# Patient Record
Sex: Female | Born: 1990 | Race: White | Hispanic: No | Marital: Married | Smoking: Current some day smoker
Health system: Southern US, Community
[De-identification: ages and names within clinical notes are randomized; demographics above are authoritative.]

## PROBLEM LIST (undated history)

## (undated) DIAGNOSIS — E039 Hypothyroidism, unspecified: Secondary | ICD-10-CM

## (undated) HISTORY — PX: TUBAL LIGATION: SHX77

## (undated) HISTORY — PX: ADENOIDECTOMY: SUR15

## (undated) HISTORY — PX: TONSILLECTOMY: SUR1361

## (undated) HISTORY — PX: MYRINGOTOMY: SUR874

## (undated) HISTORY — PX: DILATION AND CURETTAGE OF UTERUS: SHX78

---
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
## 2008-01-24 ENCOUNTER — Inpatient Hospital Stay (HOSPITAL_COMMUNITY): Admission: AD | Admit: 2008-01-24 | Discharge: 2008-01-24 | Payer: Self-pay | Admitting: Obstetrics & Gynecology

## 2008-03-20 ENCOUNTER — Inpatient Hospital Stay (HOSPITAL_COMMUNITY): Admission: AD | Admit: 2008-03-20 | Discharge: 2008-03-21 | Payer: Self-pay | Admitting: Obstetrics & Gynecology

## 2008-03-25 ENCOUNTER — Inpatient Hospital Stay (HOSPITAL_COMMUNITY): Admission: AD | Admit: 2008-03-25 | Discharge: 2008-03-27 | Payer: Self-pay | Admitting: Obstetrics & Gynecology

## 2008-08-14 IMAGING — US US OB TRANSVAGINAL
1 series · 14 of 26 positions shown · non-contrast
Comparison: none

CLINICAL DATA: Bleeding, cramping.  
 TRANSVAGINAL OBSTETRICAL US:
TECHNIQUE: Transvaginal ultrasound was performed for evaluation of the gestation as well as the maternal uterus and adnexal regions.

[Series 1: us ob transvaginal · 0.13mm/px · 14 of 26 slices shown]
[im 1/26]
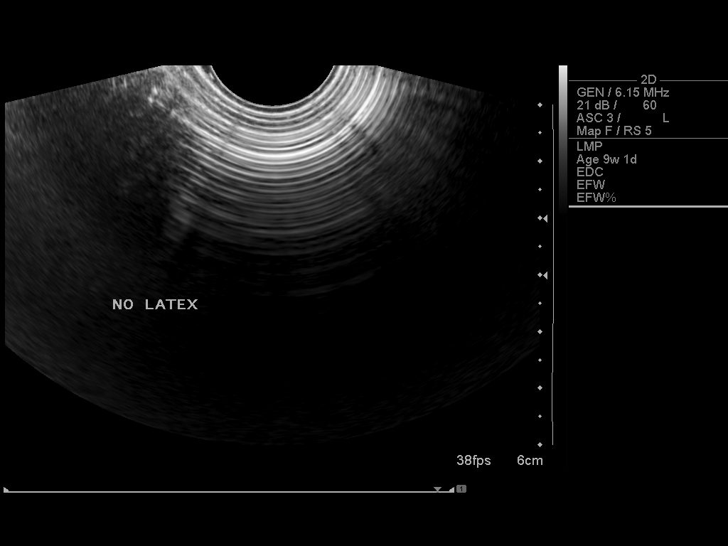
[im 3/26]
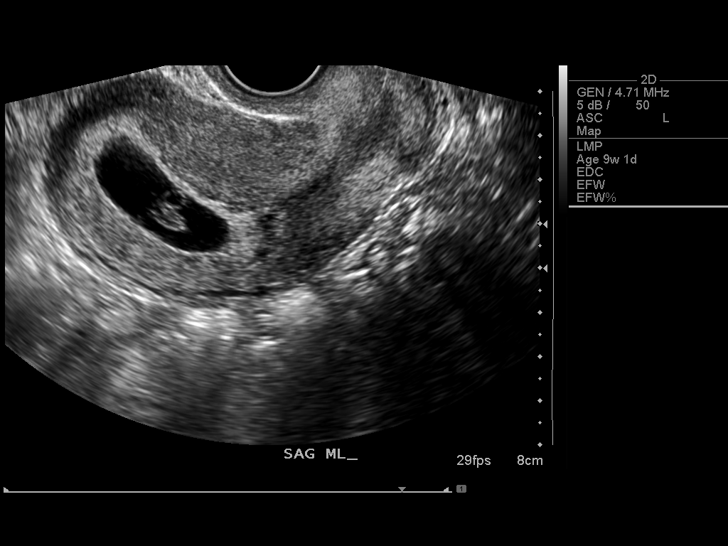
[im 5/26]
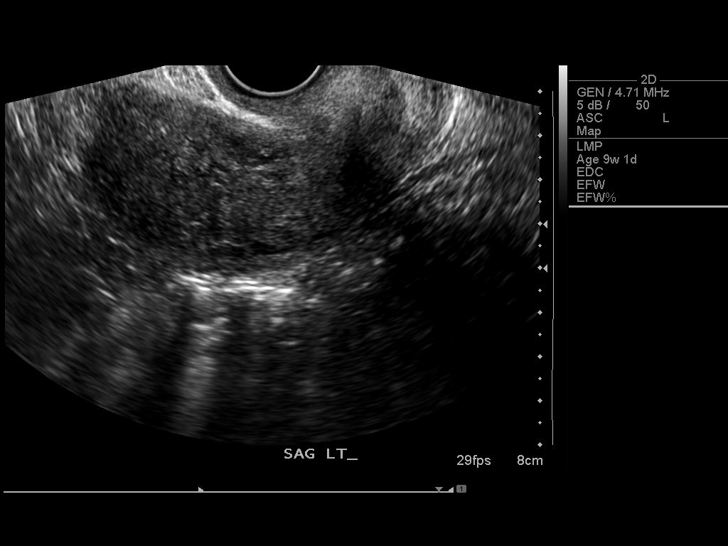
[im 7/26]
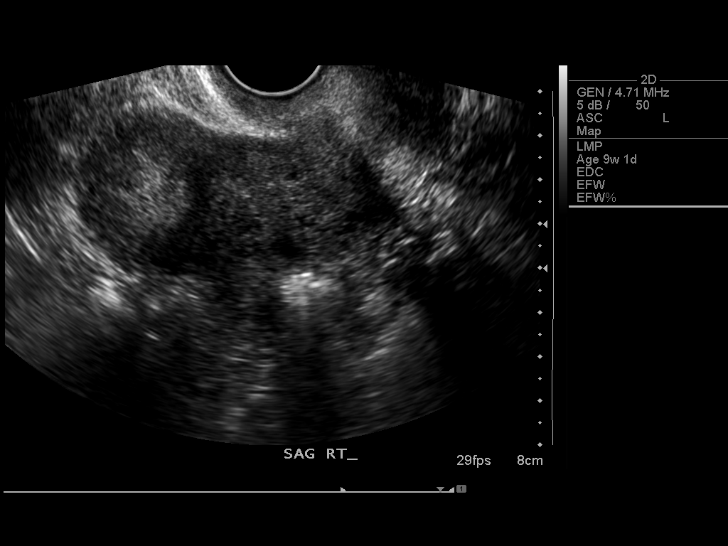
[im 9/26]
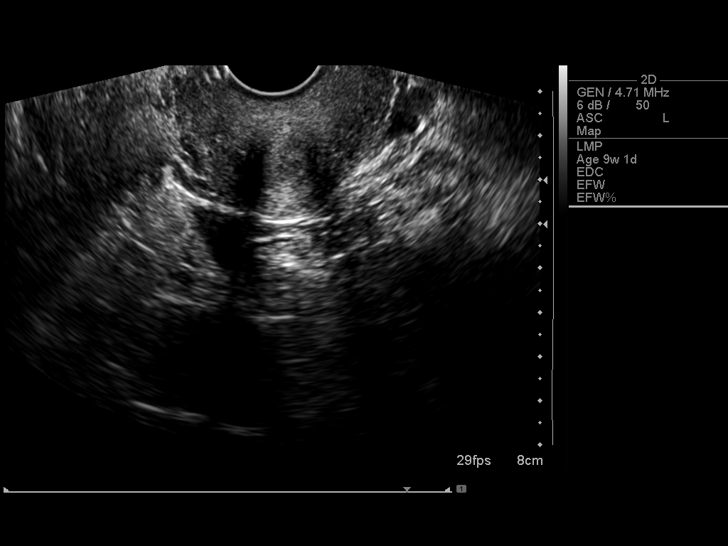
[im 11/26]
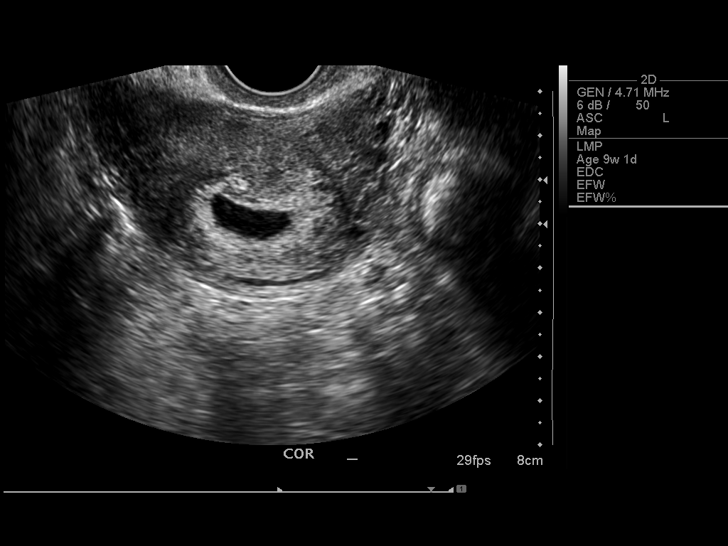
[im 13/26]
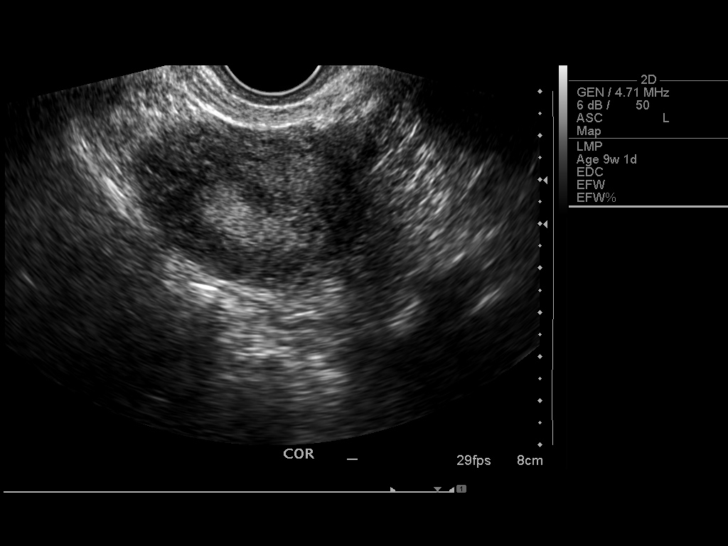
[im 14/26]
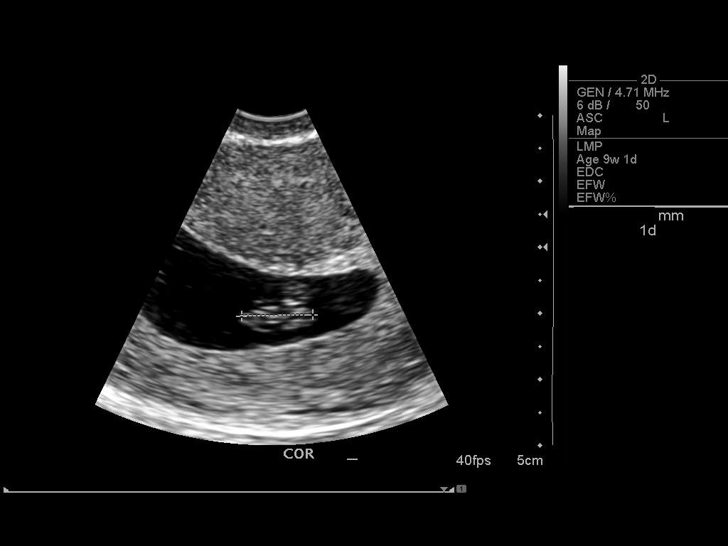
[im 16/26]
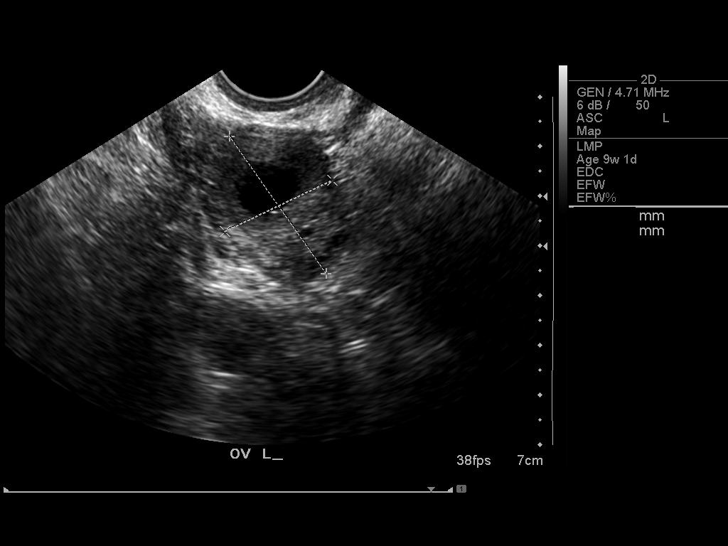
[im 18/26]
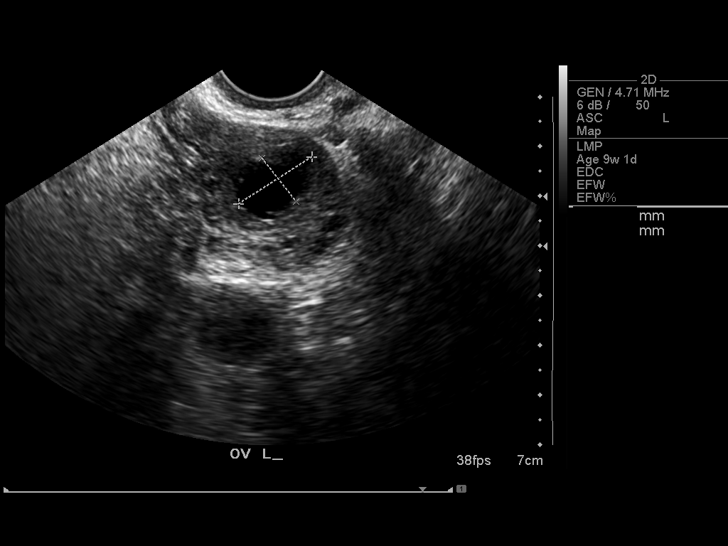
[im 20/26]
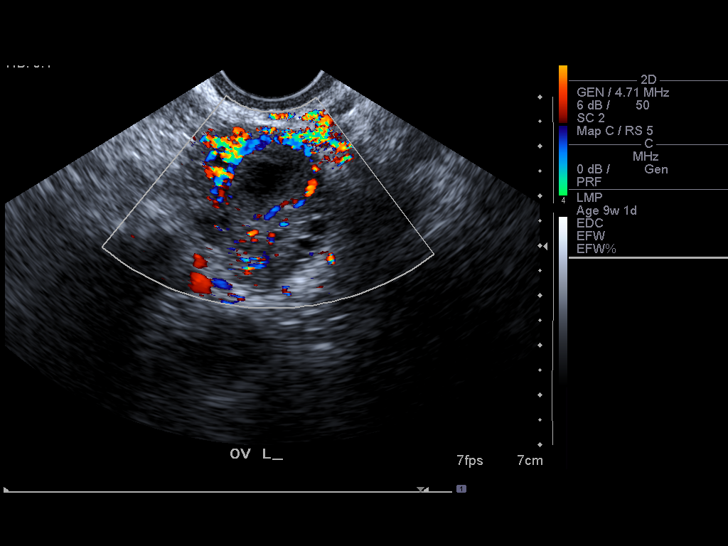
[im 22/26]
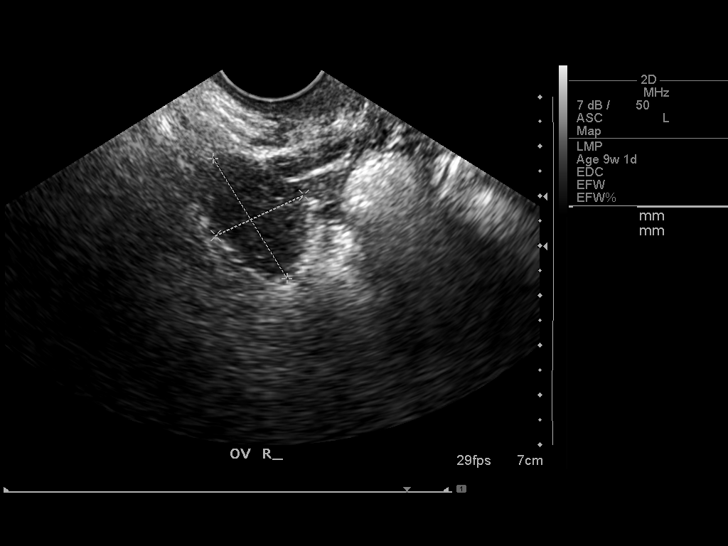
[im 24/26]
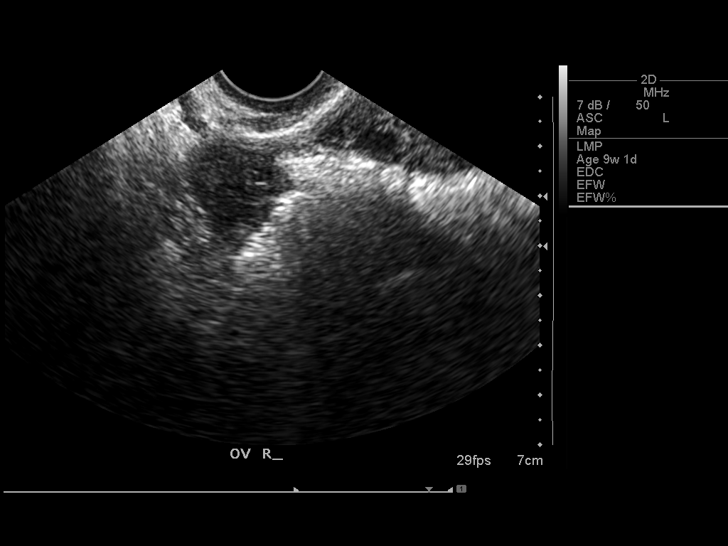
[im 26/26]
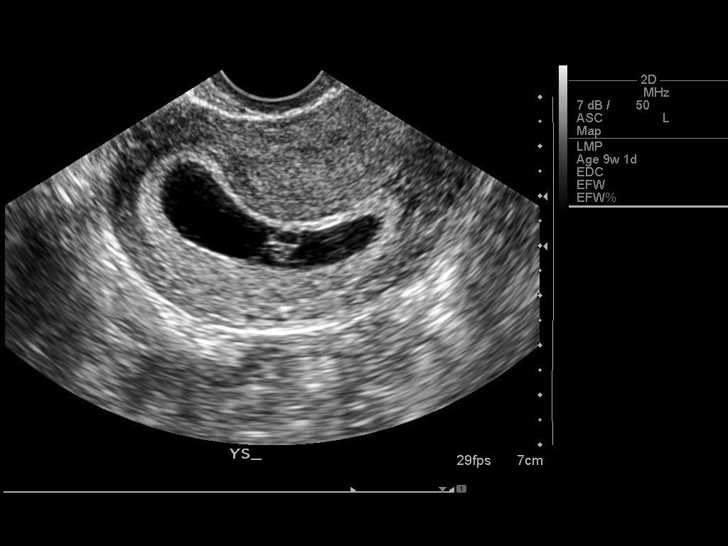

[14 of 26 positions shown; findings below may reference images not displayed]

FINDINGS: An intrauterine gestational sac containing a yolk sac and fetal pole are all identified.  Fetal heart rate is present at 144 bpm.  The crown-rump length is 10.8 mm resulting in an estimated gestational age of 7 weeks 1 day.  No subchorionic hemorrhage.  No free fluid.  Ovaries are within normal limits including a 1.7 cm simple cyst in the left ovary.
IMPRESSION: Live intrauterine pregnancy with an estimated gestational age 7 weeks 1 day.  Fetal heart rate is 144 bpm.

## 2009-01-11 ENCOUNTER — Inpatient Hospital Stay (HOSPITAL_COMMUNITY): Admission: AD | Admit: 2009-01-11 | Discharge: 2009-01-11 | Payer: Self-pay | Admitting: Obstetrics and Gynecology

## 2009-01-11 ENCOUNTER — Ambulatory Visit: Payer: Self-pay | Admitting: Advanced Practice Midwife

## 2009-03-07 ENCOUNTER — Inpatient Hospital Stay (HOSPITAL_COMMUNITY): Admission: AD | Admit: 2009-03-07 | Discharge: 2009-03-08 | Payer: Self-pay | Admitting: Obstetrics & Gynecology

## 2009-04-10 ENCOUNTER — Inpatient Hospital Stay (HOSPITAL_COMMUNITY): Admission: AD | Admit: 2009-04-10 | Discharge: 2009-04-10 | Payer: Self-pay | Admitting: Obstetrics & Gynecology

## 2009-04-14 ENCOUNTER — Inpatient Hospital Stay (HOSPITAL_COMMUNITY): Admission: AD | Admit: 2009-04-14 | Discharge: 2009-04-14 | Payer: Self-pay | Admitting: Obstetrics & Gynecology

## 2009-04-29 ENCOUNTER — Ambulatory Visit (HOSPITAL_COMMUNITY): Admission: RE | Admit: 2009-04-29 | Discharge: 2009-04-29 | Payer: Self-pay | Admitting: Obstetrics & Gynecology

## 2009-06-21 ENCOUNTER — Inpatient Hospital Stay (HOSPITAL_COMMUNITY): Admission: AD | Admit: 2009-06-21 | Discharge: 2009-06-21 | Payer: Self-pay | Admitting: Obstetrics

## 2009-07-16 ENCOUNTER — Ambulatory Visit (HOSPITAL_COMMUNITY): Admission: RE | Admit: 2009-07-16 | Discharge: 2009-07-16 | Payer: Self-pay | Admitting: Obstetrics

## 2009-07-26 ENCOUNTER — Ambulatory Visit (HOSPITAL_COMMUNITY): Admission: RE | Admit: 2009-07-26 | Discharge: 2009-07-26 | Payer: Self-pay | Admitting: Obstetrics & Gynecology

## 2009-08-23 ENCOUNTER — Ambulatory Visit (HOSPITAL_COMMUNITY): Admission: RE | Admit: 2009-08-23 | Discharge: 2009-08-23 | Payer: Self-pay | Admitting: Obstetrics & Gynecology

## 2009-09-04 ENCOUNTER — Inpatient Hospital Stay (HOSPITAL_COMMUNITY): Admission: RE | Admit: 2009-09-04 | Discharge: 2009-09-06 | Payer: Self-pay | Admitting: Obstetrics & Gynecology

## 2009-12-20 ENCOUNTER — Ambulatory Visit (HOSPITAL_COMMUNITY): Admission: RE | Admit: 2009-12-20 | Discharge: 2009-12-20 | Payer: Self-pay | Admitting: Obstetrics & Gynecology

## 2010-04-22 LAB — CBC
HCT: 40.2 % (ref 36.0–46.0)
MCH: 29.9 pg (ref 26.0–34.0)
MCV: 88.9 fL (ref 78.0–100.0)
RBC: 4.52 MIL/uL (ref 3.87–5.11)
WBC: 7.6 10*3/uL (ref 4.0–10.5)

## 2010-04-26 LAB — RH IMMUNE GLOB WKUP(>/=20WKS)(NOT WOMEN'S HOSP): Fetal Screen: NEGATIVE

## 2010-04-26 LAB — CBC
HCT: 30.3 % — ABNORMAL LOW (ref 36.0–46.0)
HCT: 34.7 % — ABNORMAL LOW (ref 36.0–46.0)
Hemoglobin: 10.3 g/dL — ABNORMAL LOW (ref 12.0–15.0)
Hemoglobin: 11.9 g/dL — ABNORMAL LOW (ref 12.0–15.0)
MCH: 30.9 pg (ref 26.0–34.0)
MCH: 31.2 pg (ref 26.0–34.0)
MCHC: 34 g/dL (ref 30.0–36.0)
MCHC: 34.4 g/dL (ref 30.0–36.0)
MCV: 90 fL (ref 78.0–100.0)
MCV: 91.8 fL (ref 78.0–100.0)
RDW: 13.5 % (ref 11.5–15.5)

## 2010-04-27 LAB — URINALYSIS, ROUTINE W REFLEX MICROSCOPIC
Bilirubin Urine: NEGATIVE
Nitrite: NEGATIVE
Specific Gravity, Urine: >1.03 — ABNORMAL HIGH (ref 1.005–1.030)
Urobilinogen, UA: 1 mg/dL (ref 0.0–1.0)

## 2010-04-28 LAB — RH IMMUNE GLOBULIN WORKUP (NOT WOMEN'S HOSP): Antibody Screen: NEGATIVE

## 2010-04-29 LAB — URINALYSIS, ROUTINE W REFLEX MICROSCOPIC
Nitrite: NEGATIVE
Specific Gravity, Urine: 1.02 (ref 1.005–1.030)
Urobilinogen, UA: 0.2 mg/dL (ref 0.0–1.0)
pH: 7 (ref 5.0–8.0)

## 2010-04-29 LAB — URINE MICROSCOPIC-ADD ON

## 2010-05-04 LAB — URINALYSIS, ROUTINE W REFLEX MICROSCOPIC
Bilirubin Urine: NEGATIVE
Bilirubin Urine: NEGATIVE
Hgb urine dipstick: NEGATIVE
Ketones, ur: NEGATIVE mg/dL
Nitrite: NEGATIVE
Protein, ur: NEGATIVE mg/dL
Specific Gravity, Urine: >1.03 — ABNORMAL HIGH (ref 1.005–1.030)
Urobilinogen, UA: 0.2 mg/dL (ref 0.0–1.0)
Urobilinogen, UA: 0.2 mg/dL (ref 0.0–1.0)

## 2010-05-04 LAB — WET PREP, GENITAL: Trich, Wet Prep: NONE SEEN

## 2010-05-04 LAB — GC/CHLAMYDIA PROBE AMP, GENITAL
Chlamydia, DNA Probe: NEGATIVE
GC Probe Amp, Genital: NEGATIVE

## 2010-05-13 LAB — URINALYSIS, ROUTINE W REFLEX MICROSCOPIC
Bilirubin Urine: NEGATIVE
Glucose, UA: NEGATIVE mg/dL
Hgb urine dipstick: NEGATIVE
Ketones, ur: 15 mg/dL — AB
Nitrite: NEGATIVE
Protein, ur: NEGATIVE mg/dL
Specific Gravity, Urine: >1.03 — ABNORMAL HIGH (ref 1.005–1.030)
Urobilinogen, UA: 0.2 mg/dL (ref 0.0–1.0)
pH: 5.5 (ref 5.0–8.0)

## 2010-05-13 LAB — GC/CHLAMYDIA PROBE AMP, GENITAL
Chlamydia, DNA Probe: NEGATIVE
GC Probe Amp, Genital: NEGATIVE

## 2010-05-13 LAB — POCT PREGNANCY, URINE: Preg Test, Ur: POSITIVE

## 2010-05-13 LAB — WET PREP, GENITAL
Trich, Wet Prep: NONE SEEN
Yeast Wet Prep HPF POC: NONE SEEN

## 2010-05-27 LAB — RH IMMUNE GLOB WKUP(>/=20WKS)(NOT WOMEN'S HOSP): Fetal Screen: NEGATIVE

## 2010-05-27 LAB — RPR: RPR Ser Ql: NONREACTIVE

## 2010-05-27 LAB — CBC
Hemoglobin: 10.6 g/dL — ABNORMAL LOW (ref 12.0–15.0)
Platelets: 217 10*3/uL (ref 150–400)
RBC: 3.45 MIL/uL — ABNORMAL LOW (ref 3.87–5.11)
WBC: 11.5 10*3/uL — ABNORMAL HIGH (ref 4.0–10.5)

## 2010-06-24 NOTE — Op Note (Signed)
NAMETALYIA, ALLENDE               ACCOUNT NO.:  [id number]   MEDICAL RECORD NO.:  [id number]          PATIENT TYPE:  AMB   LOCATION:  SDC                           FACILITY:  [facility]   PHYSICIAN:  Roseanna Rainbow, M.D.DATE OF BIRTH:  1990/08/19   DATE OF PROCEDURE:  05/11/2007  DATE OF DISCHARGE:                               OPERATIVE REPORT   PREOPERATIVE DIAGNOSIS:  Missed abortion   POSTOPERATIVE DIAGNOSIS:  Missed abortion   PROCEDURE:  Suction dilatation and curettage.   SURGEON:  Roseanna Rainbow, M.D.   ANESTHESIA:  Managed anesthesia care, paracervical block.   PATHOLOGY:  Products of conception.   ESTIMATED BLOOD LOSS:  Minimal.   COMPLICATIONS:  None.   DESCRIPTION OF PROCEDURE:  The patient is taken to the operating room  with an IV running.  She was placed in the dorsal lithotomy position and  prepped and draped in the usual sterile fashion.  After a time-out had  been completed, the anterior lip of the cervix was infiltrated with 2 mL  of 1% lidocaine.  The single-tooth tenaculum was then applied to this  location.  Four mL of 1% lidocaine were then injected at 4 and 7 o'clock  to produce a paracervical block.  The cervix was slightly dilated.  The  cervix was then dilated with [facility] dilators.  A 9 mm suction curette was  then advanced into the intrauterine cavity.  The curette was activated  and rotated to evacuate the uterus of the products of conception.  A  polyp forceps was used to reach the products of conception that were  extruding from the external os.  A sharp curettage was then performed.  A final pass was then made with the suction curette.  The single-tooth  tenaculum was then removed with minimal bleeding noted from the cervix.  At the closure of this procedure, the instrument and pack counts were  said to be correct x2.  The patient was taken to the PACU awake and in  stable condition.      Roseanna Rainbow, M.D.  Electronically Signed     LAJ/MEDQ  D:  05/11/2007  T:  05/11/2007  Job:  [id number]

## 2010-08-22 ENCOUNTER — Inpatient Hospital Stay (INDEPENDENT_AMBULATORY_CARE_PROVIDER_SITE_OTHER)
Admission: RE | Admit: 2010-08-22 | Discharge: 2010-08-22 | Disposition: A | Discharge status: Home / Self Care | Payer: Self-pay | Source: Ambulatory Visit | Attending: Emergency Medicine | Admitting: Emergency Medicine

## 2010-08-22 ENCOUNTER — Ambulatory Visit (INDEPENDENT_AMBULATORY_CARE_PROVIDER_SITE_OTHER): Payer: Self-pay

## 2010-08-22 DIAGNOSIS — IMO0002 Reserved for concepts with insufficient information to code with codable children: Secondary | ICD-10-CM

## 2010-08-22 DIAGNOSIS — S139XXA Sprain of joints and ligaments of unspecified parts of neck, initial encounter: Secondary | ICD-10-CM

## 2010-10-30 LAB — WET PREP, GENITAL
Clue Cells Wet Prep HPF POC: NONE SEEN
Trich, Wet Prep: NONE SEEN

## 2010-10-30 LAB — URINALYSIS, ROUTINE W REFLEX MICROSCOPIC
Ketones, ur: 15 — AB
Nitrite: NEGATIVE
Urobilinogen, UA: 0.2

## 2010-10-30 LAB — HCG, QUANTITATIVE, PREGNANCY
hCG, Beta Chain, Quant, S: 165 — ABNORMAL HIGH
hCG, Beta Chain, Quant, S: 1893 — ABNORMAL HIGH

## 2010-10-30 LAB — CBC
MCHC: 33.9
Platelets: 269
RBC: 4.67
WBC: 9

## 2010-10-30 LAB — ABO/RH: ABO/RH(D): A NEG

## 2010-10-30 LAB — GC/CHLAMYDIA PROBE AMP, GENITAL: GC Probe Amp, Genital: NEGATIVE

## 2010-10-31 LAB — CBC
HCT: 35.9 — ABNORMAL LOW
Hemoglobin: 12.5
MCV: 88.4
Platelets: 252
WBC: 11.4 — ABNORMAL HIGH

## 2010-10-31 LAB — RH IMMUNE GLOBULIN WORKUP (NOT WOMEN'S HOSP)
ABO/RH(D): A NEG
Antibody Screen: NEGATIVE

## 2010-11-04 LAB — RH IMMUNE GLOBULIN WORKUP (NOT WOMEN'S HOSP)
ABO/RH(D): A NEG
Antibody Screen: POSITIVE

## 2010-11-04 LAB — CBC
MCHC: 34.2
MCV: 88.7
Platelets: 210
RBC: 4.26
RDW: 12.9

## 2010-11-06 LAB — CBC
Hemoglobin: 12.5
MCHC: 34.2
MCV: 92.1
RBC: 3.97
WBC: 10.7 — ABNORMAL HIGH

## 2010-11-06 LAB — GC/CHLAMYDIA PROBE AMP, GENITAL: GC Probe Amp, Genital: NEGATIVE

## 2010-11-06 LAB — WET PREP, GENITAL: Trich, Wet Prep: NONE SEEN

## 2010-11-06 LAB — POCT PREGNANCY, URINE
Operator id: 12753
Operator id: 12753
Preg Test, Ur: POSITIVE
Preg Test, Ur: POSITIVE

## 2010-11-06 LAB — HCG, QUANTITATIVE, PREGNANCY: hCG, Beta Chain, Quant, S: 61056 — ABNORMAL HIGH

## 2010-11-07 LAB — URINALYSIS, ROUTINE W REFLEX MICROSCOPIC
Bilirubin Urine: NEGATIVE
Glucose, UA: NEGATIVE
Hgb urine dipstick: NEGATIVE
Ketones, ur: NEGATIVE
Nitrite: NEGATIVE
Protein, ur: NEGATIVE
Specific Gravity, Urine: >1.03 — ABNORMAL HIGH
Urobilinogen, UA: 0.2
pH: 6

## 2010-11-11 LAB — COMPREHENSIVE METABOLIC PANEL
ALT: 20
AST: 15
Albumin: 3.2 — ABNORMAL LOW
Alkaline Phosphatase: 55
CO2: 24
Chloride: 106
GFR calc Af Amer: >60
Potassium: 4.3
Total Bilirubin: 0.4

## 2010-11-11 LAB — CBC
Platelets: 216
RBC: 3.48 — ABNORMAL LOW
WBC: 10.1

## 2010-11-11 LAB — URINALYSIS, ROUTINE W REFLEX MICROSCOPIC
Glucose, UA: NEGATIVE
Protein, ur: NEGATIVE
Specific Gravity, Urine: 1.02
Urobilinogen, UA: 0.2

## 2010-11-12 ENCOUNTER — Emergency Department (HOSPITAL_COMMUNITY)
Admission: EM | Admit: 2010-11-12 | Discharge: 2010-11-12 | Discharge status: Against Medical Advice | Payer: Self-pay | Attending: Emergency Medicine | Admitting: Emergency Medicine

## 2010-11-12 DIAGNOSIS — R21 Rash and other nonspecific skin eruption: Secondary | ICD-10-CM | POA: Insufficient documentation

## 2010-11-14 LAB — WET PREP, GENITAL

## 2010-11-14 LAB — GLUCOSE, CAPILLARY: Glucose-Capillary: 76 mg/dL (ref 70–99)

## 2010-11-14 LAB — URINALYSIS, ROUTINE W REFLEX MICROSCOPIC
Bilirubin Urine: NEGATIVE
Nitrite: NEGATIVE
Specific Gravity, Urine: 1.01 (ref 1.005–1.030)
Urobilinogen, UA: 0.2 mg/dL (ref 0.0–1.0)

## 2010-11-14 LAB — URINE MICROSCOPIC-ADD ON

## 2010-11-14 LAB — RH IMMUNE GLOBULIN WORKUP (NOT WOMEN'S HOSP)

## 2010-11-18 LAB — URINALYSIS, ROUTINE W REFLEX MICROSCOPIC
Bilirubin Urine: NEGATIVE
Nitrite: NEGATIVE
Specific Gravity, Urine: 1.028
pH: 6

## 2010-11-18 LAB — URINE CULTURE

## 2010-11-18 LAB — URINE MICROSCOPIC-ADD ON

## 2010-11-19 LAB — URINALYSIS, ROUTINE W REFLEX MICROSCOPIC
Bilirubin Urine: NEGATIVE
Glucose, UA: NEGATIVE
Ketones, ur: NEGATIVE
Nitrite: NEGATIVE
Protein, ur: 100 — AB
Specific Gravity, Urine: 1.021
Urobilinogen, UA: 1
pH: 7.5

## 2010-11-19 LAB — URINE CULTURE: Colony Count: >=100000

## 2010-11-19 LAB — URINE MICROSCOPIC-ADD ON

## 2010-11-19 LAB — PREGNANCY, URINE: Preg Test, Ur: NEGATIVE

## 2010-12-09 ENCOUNTER — Inpatient Hospital Stay (INDEPENDENT_AMBULATORY_CARE_PROVIDER_SITE_OTHER)
Admission: RE | Admit: 2010-12-09 | Discharge: 2010-12-09 | Disposition: A | Discharge status: Home / Self Care | Payer: Self-pay | Source: Ambulatory Visit | Attending: Family Medicine | Admitting: Family Medicine

## 2010-12-09 DIAGNOSIS — J069 Acute upper respiratory infection, unspecified: Secondary | ICD-10-CM

## 2010-12-09 LAB — POCT RAPID STREP A: Streptococcus, Group A Screen (Direct): NEGATIVE

## 2011-02-20 ENCOUNTER — Inpatient Hospital Stay (HOSPITAL_COMMUNITY)
Admission: AD | Admit: 2011-02-20 | Discharge: 2011-02-21 | Disposition: A | Discharge status: Home / Self Care | Payer: Medicaid Other | Source: Ambulatory Visit | Attending: Obstetrics & Gynecology | Admitting: Obstetrics & Gynecology

## 2011-02-20 ENCOUNTER — Encounter (HOSPITAL_COMMUNITY): Payer: Self-pay | Admitting: *Deleted

## 2011-02-20 DIAGNOSIS — N72 Inflammatory disease of cervix uteri: Secondary | ICD-10-CM | POA: Insufficient documentation

## 2011-02-20 DIAGNOSIS — R300 Dysuria: Secondary | ICD-10-CM | POA: Insufficient documentation

## 2011-02-20 DIAGNOSIS — R350 Frequency of micturition: Secondary | ICD-10-CM | POA: Insufficient documentation

## 2011-02-20 HISTORY — DX: Hypothyroidism, unspecified: E03.9

## 2011-02-20 LAB — POCT PREGNANCY, URINE: Preg Test, Ur: NEGATIVE

## 2011-02-20 NOTE — Progress Notes (Signed)
G3P2 SAB1. Lower abd cramping for 2 wks. 1 wk ago urine was "strong and dark in color for 2 days" Had urinary urgency for these 2 days and then stopped. Stopped drinking a lot of coffee and dark, strong urine went away. Lower abd cramping continues and urinary urgency returned for past 2 days. Denies dysuria.

## 2011-02-21 LAB — URINALYSIS, ROUTINE W REFLEX MICROSCOPIC
Glucose, UA: NEGATIVE mg/dL
Ketones, ur: NEGATIVE mg/dL
Leukocytes, UA: NEGATIVE
Nitrite: NEGATIVE
Protein, ur: NEGATIVE mg/dL

## 2011-02-21 LAB — WET PREP, GENITAL
Trich, Wet Prep: NONE SEEN
Yeast Wet Prep HPF POC: NONE SEEN

## 2011-02-21 MED ORDER — AZITHROMYCIN 250 MG PO TABS
1000.0000 mg | ORAL_TABLET | Freq: Once | ORAL | Status: AC
  Administered 2011-02-21: 1000 mg via ORAL
  Filled 2011-02-21: qty 4

## 2011-02-21 MED ORDER — PHENAZOPYRIDINE HCL 100 MG PO TABS
200.0000 mg | ORAL_TABLET | Freq: Once | ORAL | Status: AC
  Administered 2011-02-21: 200 mg via ORAL
  Filled 2011-02-21: qty 2

## 2011-02-21 MED ORDER — PHENAZOPYRIDINE HCL 100 MG PO TABS: 100.0000 mg | ORAL_TABLET | Freq: Three times a day (TID) | ORAL | Status: AC | PRN

## 2011-02-21 NOTE — ED Notes (Signed)
Kerrie [facility] NP in to see pt and discuss lab results and d/c plan

## 2011-02-21 NOTE — ED Notes (Signed)
Crackers and juice given to pt before taking meds.

## 2011-02-21 NOTE — Progress Notes (Signed)
[facility] NP in to see pt. Spec exam done and GC/Chlam and wet prep obtained. Pt tol well.

## 2011-02-21 NOTE — ED Provider Notes (Signed)
History     CSN: [id number]  Arrival date & time 02/20/11  2316   None     Chief Complaint  Patient presents with  . Abdominal Pain  . Urinary Urgency   HPI Gail Stanley is a 21 y.o. female who presents to MAU for burning with urination that has been off and on for the past couple weeks but the past few days has gotten worse with pressure in lower abdomen when voiding. Patient denies fever, chills, n/v or other problems. Last pap smear 4 months ago at [facility] and was normal. Current sex partner x 5 years. BTL for birth control. The history was provided by the patient.  Past Medical History  Diagnosis Date  . Hypothyroidism     Past Surgical History  Procedure Date  . Tubal ligation   . Tonsillectomy   . Dilation and curettage of uterus   . Adenoidectomy   . Myringotomy     Family History  Problem Relation Age of Onset  . Anesthesia problems Neg Hx     History  Substance Use Topics  . Smoking status: Current Some Day Smoker  . Smokeless tobacco: Never Used  . Alcohol Use: No    OB History    Grav Para Term Preterm Abortions TAB SAB Ect Mult Living   3 2 2  0 1 0 1 0 0 2      Review of Systems  Constitutional: Negative for fever, chills, diaphoresis and fatigue.  HENT: Negative for ear pain, congestion, sore throat, facial swelling, neck pain, neck stiffness, dental problem and sinus pressure.   Eyes: Negative for photophobia, pain and discharge.  Respiratory: Negative for cough, chest tightness and wheezing.   Cardiovascular: Negative.   Gastrointestinal: Negative for nausea, vomiting, abdominal pain, diarrhea, constipation and abdominal distention.  Genitourinary: Positive for dysuria, urgency and frequency. Negative for flank pain, vaginal bleeding, vaginal discharge and difficulty urinating.  Musculoskeletal: Negative for myalgias, back pain and gait problem.  Skin: Negative for color change and rash.  Neurological: Negative for dizziness, speech  difficulty, weakness, light-headedness, numbness and headaches.  Psychiatric/Behavioral: Negative for confusion and agitation.    Allergies  Penicillins  Home Medications  No current outpatient prescriptions on file.  BP 135/85  Pulse 86  Temp(Src) 97.8 F (36.6 C) (Oral)  Resp 20  Ht 5\' 9"  (1.753 m)  Wt 205 lb 9.6 oz (93.26 kg)  BMI 30.36 kg/m2  LMP 01/31/2011  Physical Exam  Nursing note and vitals reviewed. Constitutional: She is oriented to person, place, and time. She appears well-developed and well-nourished. No distress.  HENT:  Head: Normocephalic.  Eyes: EOM are normal.  Neck: Neck supple.  Cardiovascular: Normal rate.   Pulmonary/Chest: Effort normal.  Abdominal: Soft. There is tenderness in the suprapubic area.  Genitourinary:       External genitalia without lesions. White discharge vaginal vault, cervix inflamed. No CMT, no adnexal tenderness or mass palpable. Uterus without palpable enlargement.  Musculoskeletal: Normal range of motion.  Neurological: She is alert and oriented to person, place, and time. No cranial nerve deficit.  Skin: Skin is warm and dry.  Psychiatric: She has a normal mood and affect. Her behavior is normal. Judgment and thought content normal.   Results for orders placed during the hospital encounter of 02/20/11 (from the past 24 hour(s))  POCT PREGNANCY, URINE     Status: Normal   Collection Time   02/20/11 11:39 PM      Component Value Range  Preg Test, Ur NEGATIVE    URINALYSIS, ROUTINE W REFLEX MICROSCOPIC     Status: Abnormal   Collection Time   02/20/11 11:40 PM      Component Value Range   Color, Urine YELLOW  YELLOW    APPearance CLEAR  CLEAR    Specific Gravity, Urine >1.030 (*) 1.005 - 1.030    pH 6.0  5.0 - 8.0    Glucose, UA NEGATIVE  NEGATIVE (mg/dL)   Hgb urine dipstick NEGATIVE  NEGATIVE    Bilirubin Urine NEGATIVE  NEGATIVE    Ketones, ur NEGATIVE  NEGATIVE (mg/dL)   Protein, ur NEGATIVE  NEGATIVE (mg/dL)    Urobilinogen, UA 0.2  0.0 - 1.0 (mg/dL)   Nitrite NEGATIVE  NEGATIVE    Leukocytes, UA NEGATIVE  NEGATIVE   WET PREP, GENITAL     Status: Abnormal   Collection Time   02/21/11 12:00 AM      Component Value Range   Yeast, Wet Prep NONE SEEN  NONE SEEN    Trich, Wet Prep NONE SEEN  NONE SEEN    Clue Cells, Wet Prep FEW (*) NONE SEEN    WBC, Wet Prep HPF POC MANY (*) NONE SEEN    Assessment: Urinary frequency, dysuria   Cervicitis  Plan:  Culture urine   GC, Chlamydia cultures pending   Pyridium 200 mg. Po now   Zithromax 1 gram po x 1   Follow up with [facility]   ED Course  Procedures  MDM  Note: when ready for discharge patient requested strep screen. On exam there was mild erythema of the posterior pharynx. No cervical lymph adenopathy noted. Rapid strep sent and was negative. Patient to follow up with Dr. Dareen Piano as planned.        [place], [place] 02/23/11 [phone]

## 2011-02-21 NOTE — Progress Notes (Signed)
Written and verbal d/c instructions given and understanding voiced. 

## 2011-02-25 NOTE — ED Provider Notes (Signed)
Attestation of Attending Supervision of Advanced Practitioner: Evaluation and management procedures were performed by the PA/NP/CNM/OB Fellow under my supervision/collaboration. Chart reviewed and agree with management and plan.  Montford Barg V 02/25/2011 11:58 AM    

## 2013-03-20 ENCOUNTER — Other Ambulatory Visit: Payer: Self-pay | Admitting: *Deleted

## 2013-03-20 ENCOUNTER — Ambulatory Visit (INDEPENDENT_AMBULATORY_CARE_PROVIDER_SITE_OTHER): Payer: Medicaid Other | Admitting: Obstetrics & Gynecology

## 2013-03-20 ENCOUNTER — Encounter: Payer: Self-pay | Admitting: Obstetrics & Gynecology

## 2013-03-20 VITALS — BP 129/88 | HR 80 | Temp 98.5°F | Wt 217.0 lb

## 2013-03-20 DIAGNOSIS — Z308 Encounter for other contraceptive management: Secondary | ICD-10-CM

## 2013-03-20 DIAGNOSIS — R102 Pelvic and perineal pain: Secondary | ICD-10-CM

## 2013-03-20 DIAGNOSIS — Z3202 Encounter for pregnancy test, result negative: Secondary | ICD-10-CM

## 2013-03-20 DIAGNOSIS — Z0000 Encounter for general adult medical examination without abnormal findings: Secondary | ICD-10-CM

## 2013-03-20 DIAGNOSIS — N949 Unspecified condition associated with female genital organs and menstrual cycle: Secondary | ICD-10-CM

## 2013-03-20 DIAGNOSIS — Z124 Encounter for screening for malignant neoplasm of cervix: Secondary | ICD-10-CM

## 2013-03-20 LAB — POCT URINALYSIS DIPSTICK
BILIRUBIN UA: NEGATIVE
Blood, UA: NEGATIVE
Glucose, UA: NEGATIVE
KETONES UA: NEGATIVE
LEUKOCYTES UA: NEGATIVE
Nitrite, UA: NEGATIVE
Protein, UA: NEGATIVE
Spec Grav, UA: 1.02
Urobilinogen, UA: NEGATIVE
pH, UA: 5

## 2013-03-20 LAB — POCT URINE PREGNANCY: PREG TEST UR: NEGATIVE

## 2013-03-20 NOTE — Progress Notes (Signed)
Subjective:     Gail Stanley is a 23 y.o. female here for a routine exam.  Current complaints: Pt states that her cycles are heavy and painful since essure procedure was done.  Pt states that she has pain in lower abdomen that radiates to back.  Pt states that pain is dull and achy but can be sharp pain during intercourse.  Pt has been seen at hospital several times for pain and was referred to see physician.  Pt states that she did not have f/u for placement of essure.  Pt states that she has tried tylenol, rest, warm baths for pain with no relief.  Pt states that insurace didn't cover procedure.  Pt states that she frequently gets UTI's as well.  Pt states that she can't loose any weight.  Pt feels as though she is bloated and swollen in abdomen.  Pt states she has been having hot flashes and night sweats. Pt would like to have her thyroid checked at today's visit as well. Pt has previously been on medication for thyroid.   Personal health questionnaire reviewed: yes.   Gynecologic History Patient's last menstrual period was 03/15/2013. Contraception: essure, placed in November 2011 Last Pap: 2015. Results were: normal Last mammogram: n/a  Obstetric History OB History  Gravida Para Term Preterm AB SAB TAB Ectopic Multiple Living  3 2 2  0 1 1 0 0 0 2    # Outcome Date GA Lbr Len/2nd Weight Sex Delivery Anes PTL Lv  3 TRM     F SVD     2 TRM     F SVD     1 SAB                The following portions of the patient's history were reviewed and updated as appropriate: allergies, current medications, past family history, past medical history, past social history, past surgical history and problem list.  Review of Systems Pertinent items are noted in HPI.  Objective:    BP 129/88  Pulse 80  Temp(Src) 98.5 F (36.9 C)  Wt 217 lb (98.431 kg)  LMP 03/15/2013  General Appearance:    Alert, cooperative, no distress, appears stated age  Abdomen:     Soft, non-tender, bowel sounds  active all four quadrants,    no masses, no organomegaly  Genitalia:    Normal female without lesion, discharge or tenderness; levator tenderness bilaterally--muscles band-like    Assessment:   Pelvic floor dysfunction Low back pain Plan:   Pelvic ultrasound/HSG Pap performed today Referral to a primary care provider for h/o thyroid dysfunction, orthopedic surgery Keep voiding diary Return after the U/S

## 2013-03-21 ENCOUNTER — Encounter: Payer: Self-pay | Admitting: Obstetrics & Gynecology

## 2013-03-21 NOTE — Patient Instructions (Signed)
Back Pain, Adult Low back pain is very common. About 1 in 5 people have back pain.The cause of low back pain is rarely dangerous. The pain often gets better over time.About half of people with a sudden onset of back pain feel better in just 2 weeks. About 8 in 10 people feel better by 6 weeks.  CAUSES Some common causes of back pain include:  Strain of the muscles or ligaments supporting the spine.  Wear and tear (degeneration) of the spinal discs.  Arthritis.  Direct injury to the back. DIAGNOSIS Most of the time, the direct cause of low back pain is not known.However, back pain can be treated effectively even when the exact cause of the pain is unknown.Answering your caregiver's questions about your overall health and symptoms is one of the most accurate ways to make sure the cause of your pain is not dangerous. If your caregiver needs more information, he or she may order lab work or imaging tests (X-rays or MRIs).However, even if imaging tests show changes in your back, this usually does not require surgery. HOME CARE INSTRUCTIONS For many people, back pain returns.Since low back pain is rarely dangerous, it is often a condition that people can learn to manageon their own.   Remain active. It is stressful on the back to sit or stand in one place. Do not sit, drive, or stand in one place for more than 30 minutes at a time. Take short walks on level surfaces as soon as pain allows.Try to increase the length of time you walk each day.  Do not stay in bed.Resting more than 1 or 2 days can delay your recovery.  Do not avoid exercise or work.Your body is made to move.It is not dangerous to be active, even though your back may hurt.Your back will likely heal faster if you return to being active before your pain is gone.  Pay attention to your body when you bend and lift. Many people have less discomfortwhen lifting if they bend their knees, keep the load close to their bodies,and  avoid twisting. Often, the most comfortable positions are those that put less stress on your recovering back.  Find a comfortable position to sleep. Use a firm mattress and lie on your side with your knees slightly bent. If you lie on your back, put a pillow under your knees.  Only take over-the-counter or prescription medicines as directed by your caregiver. Over-the-counter medicines to reduce pain and inflammation are often the most helpful.Your caregiver may prescribe muscle relaxant drugs.These medicines help dull your pain so you can more quickly return to your normal activities and healthy exercise.  Put ice on the injured area.  Put ice in a plastic bag.  Place a towel between your skin and the bag.  Leave the ice on for 15-20 minutes, 03-04 times a day for the first 2 to 3 days. After that, ice and heat may be alternated to reduce pain and spasms.  Ask your caregiver about trying back exercises and gentle massage. This may be of some benefit.  Avoid feeling anxious or stressed.Stress increases muscle tension and can worsen back pain.It is important to recognize when you are anxious or stressed and learn ways to manage it.Exercise is a great option. SEEK MEDICAL CARE IF:  You have pain that is not relieved with rest or medicine.  You have pain that does not improve in 1 week.  You have new symptoms.  You are generally not feeling well. SEEK   IMMEDIATE MEDICAL CARE IF:   You have pain that radiates from your back into your legs.  You develop new bowel or bladder control problems.  You have unusual weakness or numbness in your arms or legs.  You develop nausea or vomiting.  You develop abdominal pain.  You feel faint. Document Released: 01/26/2005 Document Revised: 07/28/2011 Document Reviewed: 06/16/2010 [facility] Patient Information 2014 [place], [place]. Myofascial Pain Syndrome Myofascial pain syndrome is a pain disorder. This pain may be felt in the muscles. It  may come and go. Myofascial pain syndrome always has trigger or tender points in the muscle that will cause pain when pressed.  CAUSES Myofascial pain may be caused by injuries, especially auto accidents, or by overuse of certain muscles. Typically the pain is long lasting. It is made worse by overuse of the involved muscles, emotional distress, and by cold, damp weather. Myofascial pain syndrome often develops in patients whose response to stress is an increase in muscle tone, and is seen in greater frequency in patients with pre-existing tension headaches. SYMPTOMS  Myofascial pain syndrome causes a wide variety of symptoms. You may see tight ropy bands of muscle. Problems may also include aching, cramping, burning, numbness, tingling, and other uncomfortable sensations in muscular areas. It most commonly affects the neck, upper back, and shoulder areas. Pain often radiates into the arms and hands.  TREATMENT Treatment includes resting the affected muscular area and applying ice packs to reduce spasm and pain. Trigger point injection, is a valuable initial therapy. This therapy is an injection of local anesthetic directly into the trigger point. Trigger points are often present at the source of pain. Pain relief following injection confirms the diagnosis of myofascial pain syndrome. Fairly vigorous therapy can be carried out during the pain-free period after each injection. Stretching exercises to loosen up the muscles are also useful. Transcutaneous electrical nerve stimulation (TENS) may provide relief from pain. TENS is the use of electric current produced by a device to stimulate the nerves. Ultrasound therapy applied directly over the affected muscle may also provide pain relief. Anti-inflammatory pain medicine can be helpful. Symptoms will gradually improve over a period of weeks to months with proper treatment. HOME CARE INSTRUCTIONS Call your caregiver for follow-up care as recommended.  SEEK  MEDICAL CARE IF:  Your pain is severe and not helped with medications. Document Released: 03/05/2004 Document Revised: 04/20/2011 Document Reviewed: 03/14/2010 [facility] Patient Information 2014 [place], [place]. Pelvic Pain, Female Female pelvic pain can be caused by many different things and start from a variety of places. Pelvic pain refers to pain that is located in the lower half of the abdomen and between your hips. The pain may occur over a short period of time (acute) or may be reoccurring (chronic). The cause of pelvic pain may be related to disorders affecting the female reproductive organs (gynecologic), but it may also be related to the bladder, kidney stones, an intestinal complication, or muscle or skeletal problems. Getting help right away for pelvic pain is important, especially if there has been severe, sharp, or a sudden onset of unusual pain. It is also important to get help right away because some types of pelvic pain can be life threatening.  CAUSES  Below are only some of the causes of pelvic pain. The causes of pelvic pain can be in one of several categories.   Gynecologic.  Pelvic inflammatory disease.  Sexually transmitted infection.  Ovarian cyst or a twisted ovarian ligament (ovarian torsion).  Uterine lining that  grows outside the uterus (endometriosis).  Fibroids, cysts, or tumors.  Ovulation.  Pregnancy.  Pregnancy that occurs outside the uterus (ectopic pregnancy).  Miscarriage.  Labor.  Abruption of the placenta or ruptured uterus.  Infection.  Uterine infection (endometritis).  Bladder infection.  Diverticulitis.  Miscarriage related to a uterine infection (septic abortion).  Bladder.  Inflammation of the bladder (cystitis).  Kidney stone(s).  Gastrointenstinal.  Constipation.  Diverticulitis.  Neurologic.  Trauma.  Feeling pelvic pain because of mental or emotional causes (psychosomatic).  Cancers of the bowel or  pelvis. EVALUATION  Your caregiver will want to take a careful history of your concerns. This includes recent changes in your health, a careful gynecologic history of your periods (menses), and a sexual history. Obtaining your family history and medical history is also important. Your caregiver may suggest a pelvic exam. A pelvic exam will help identify the location and severity of the pain. It also helps in the evaluation of which organ system may be involved. In order to identify the cause of the pelvic pain and be properly treated, your caregiver may order tests. These tests may include:   A pregnancy test.  Pelvic ultrasonography.  An X-ray exam of the abdomen.  A urinalysis or evaluation of vaginal discharge.  Blood tests. HOME CARE INSTRUCTIONS   Only take over-the-counter or prescription medicines for pain, discomfort, or fever as directed by your caregiver.   Rest as directed by your caregiver.   Eat a balanced diet.   Drink enough fluids to make your urine clear or pale yellow, or as directed.   Avoid sexual intercourse if it causes pain.   Apply warm or cold compresses to the lower abdomen depending on which one helps the pain.   Avoid stressful situations.   Keep a journal of your pelvic pain. Write down when it started, where the pain is located, and if there are things that seem to be associated with the pain, such as food or your menstrual cycle.  Follow up with your caregiver as directed.  SEEK MEDICAL CARE IF:  Your medicine does not help your pain.  You have abnormal vaginal discharge. SEEK IMMEDIATE MEDICAL CARE IF:   You have heavy bleeding from the vagina.   Your pelvic pain increases.   You feel lightheaded or faint.   You have chills.   You have pain with urination or blood in your urine.   You have uncontrolled diarrhea or vomiting.   You have a fever or persistent symptoms for more than 3 days.  You have a fever and your  symptoms suddenly get worse.   You are being physically or sexually abused.  MAKE SURE YOU:  Understand these instructions.  Will watch your condition.  Will get help if you are not doing well or get worse. Document Released: 12/24/2003 Document Revised: 07/28/2011 Document Reviewed: 05/18/2011 [facility] Patient Information 2014 [place]

## 2013-03-22 ENCOUNTER — Ambulatory Visit (HOSPITAL_COMMUNITY)
Admission: RE | Admit: 2013-03-22 | Discharge: 2013-03-22 | Disposition: A | Discharge status: Home / Self Care | Payer: Medicaid Other | Source: Ambulatory Visit | Attending: Obstetrics & Gynecology | Admitting: Obstetrics & Gynecology

## 2013-03-22 DIAGNOSIS — N949 Unspecified condition associated with female genital organs and menstrual cycle: Secondary | ICD-10-CM

## 2013-03-22 LAB — PAP IG, CT-NG, RFX HPV ASCU
Chlamydia Probe Amp: NEGATIVE
GC Probe Amp: NEGATIVE

## 2013-03-24 ENCOUNTER — Encounter: Payer: Self-pay | Admitting: Obstetrics & Gynecology

## 2013-03-24 DIAGNOSIS — R87612 Low grade squamous intraepithelial lesion on cytologic smear of cervix (LGSIL): Secondary | ICD-10-CM | POA: Insufficient documentation

## 2013-04-25 ENCOUNTER — Ambulatory Visit (HOSPITAL_COMMUNITY): Admission: RE | Admit: 2013-04-25 | Payer: Medicaid Other | Source: Ambulatory Visit

## 2013-05-25 ENCOUNTER — Ambulatory Visit: Payer: Medicaid Other | Admitting: Obstetrics & Gynecology

## 2013-12-11 ENCOUNTER — Encounter: Payer: Self-pay | Admitting: Obstetrics & Gynecology

## 2014-02-05 ENCOUNTER — Encounter: Payer: Self-pay | Admitting: *Deleted

## 2014-02-06 ENCOUNTER — Encounter: Payer: Self-pay | Admitting: Obstetrics & Gynecology

## 2017-03-17 ENCOUNTER — Encounter (HOSPITAL_COMMUNITY): Payer: Self-pay

## 2017-03-17 ENCOUNTER — Other Ambulatory Visit: Payer: Self-pay

## 2017-03-17 ENCOUNTER — Inpatient Hospital Stay (HOSPITAL_COMMUNITY)
Admission: AD | Admit: 2017-03-17 | Discharge: 2017-03-17 | Disposition: A | Discharge status: Home / Self Care | Payer: Self-pay | Source: Ambulatory Visit | Attending: Obstetrics and Gynecology | Admitting: Obstetrics and Gynecology

## 2017-03-17 DIAGNOSIS — Z880 Allergy status to penicillin: Secondary | ICD-10-CM | POA: Insufficient documentation

## 2017-03-17 DIAGNOSIS — R109 Unspecified abdominal pain: Secondary | ICD-10-CM

## 2017-03-17 DIAGNOSIS — F17210 Nicotine dependence, cigarettes, uncomplicated: Secondary | ICD-10-CM | POA: Insufficient documentation

## 2017-03-17 DIAGNOSIS — N3001 Acute cystitis with hematuria: Secondary | ICD-10-CM

## 2017-03-17 DIAGNOSIS — Z9889 Other specified postprocedural states: Secondary | ICD-10-CM | POA: Insufficient documentation

## 2017-03-17 DIAGNOSIS — Z9851 Tubal ligation status: Secondary | ICD-10-CM | POA: Insufficient documentation

## 2017-03-17 LAB — URINALYSIS, ROUTINE W REFLEX MICROSCOPIC
Bilirubin Urine: NEGATIVE
Glucose, UA: NEGATIVE mg/dL
Ketones, ur: NEGATIVE mg/dL
NITRITE: POSITIVE — AB
PROTEIN: 30 mg/dL — AB
Specific Gravity, Urine: >1.03 — ABNORMAL HIGH (ref 1.005–1.030)
pH: 6 (ref 5.0–8.0)

## 2017-03-17 LAB — URINALYSIS, MICROSCOPIC (REFLEX)

## 2017-03-17 LAB — POCT PREGNANCY, URINE: PREG TEST UR: NEGATIVE

## 2017-03-17 MED ORDER — NITROFURANTOIN MONOHYD MACRO 100 MG PO CAPS: 100.0000 mg | ORAL_CAPSULE | Freq: Two times a day (BID) | ORAL | 0 refills | Status: AC

## 2017-03-17 MED ORDER — PHENAZOPYRIDINE HCL 200 MG PO TABS: 200.0000 mg | ORAL_TABLET | Freq: Three times a day (TID) | ORAL | 0 refills | Status: AC

## 2017-03-17 NOTE — MAU Note (Signed)
Been having blood in her pee, and pain in her abd- really low.  Started 3 wks ago, was really sore.  Having pain with urination, started 3 days ago. Having frequency and urgency. Pain with intercourse last wk.

## 2017-03-17 NOTE — Discharge Instructions (Signed)
In late 2019, the [facility] will be moving to the [facility] campus. At that time, the MAU (Maternity Admissions Unit), where you are being seen today, will no longer take care of non-pregnant patients. We strongly encourage you to find a doctor's office before that time, so that you can be seen with any GYN concerns, like vaginal discharge, urinary tract infection, etc.. in a timely manner.  In order to make an office visit more convenient, the Center for [facility] at [facility] will be offering evening hours with same-day appointments, walk-in appointments and scheduled appointments available during this time.  [facility] for [facility] @ [facility] Hours: Monday - 8am - 7:30 pm with walk-in between 4pm- 7:30 pm Tuesday - 8 am - 5 pm (starting 05/11/17 we will be open late and accepting walk-ins from 4pm - 7:30pm) Wednesday - 8 am - 5 pm (starting 08/11/17 we will be open late and accepting walk-ins from 4pm - 7:30pm) Thursday 8 am - 5 pm (starting 11/11/17 we will be open late and accepting walk-ins from 4pm - 7:30pm) Friday 8 am - 5 pm  For an appointment please call the Center for [facility] @ [facility] at [phone]  For urgent needs, [facility] is also available for management of urgent GYN complaints such as vaginal discharge or urinary tract infections.      Urinary Tract Infection, Adult A urinary tract infection (UTI) is an infection of any part of the urinary tract, which includes the kidneys, ureters, bladder, and urethra. These organs make, store, and get rid of urine in the body. UTI can be a bladder infection (cystitis) or kidney infection (pyelonephritis). What are the causes? This infection may be caused by fungi, viruses, or bacteria. Bacteria are the most common cause of UTIs. This condition can also be caused by repeated incomplete emptying of the bladder during urination. What increases the risk? This condition is  more likely to develop if:  You ignore your need to urinate or hold urine for long periods of time.  You do not empty your bladder completely during urination.  You wipe back to front after urinating or having a bowel movement, if you are female.  You are uncircumcised, if you are female.  You are constipated.  You have a urinary catheter that stays in place (indwelling).  You have a weak defense (immune) system.  You have a medical condition that affects your bowels, kidneys, or bladder.  You have diabetes.  You take antibiotic medicines frequently or for long periods of time, and the antibiotics no longer work well against certain types of infections (antibiotic resistance).  You take medicines that irritate your urinary tract.  You are exposed to chemicals that irritate your urinary tract.  You are female.  What are the signs or symptoms? Symptoms of this condition include:  Fever.  Frequent urination or passing small amounts of urine frequently.  Needing to urinate urgently.  Pain or burning with urination.  Urine that smells bad or unusual.  Cloudy urine.  Pain in the lower abdomen or back.  Trouble urinating.  Blood in the urine.  Vomiting or being less hungry than normal.  Diarrhea or abdominal pain.  Vaginal discharge, if you are female.  How is this diagnosed? This condition is diagnosed with a medical history and physical exam. You will also need to provide a urine sample to test your urine. Other tests may be done, including:  Blood tests.  Sexually  disease (STD) testing. ° °If you have had more than one UTI, a cystoscopy or imaging studies may be done to determine the cause of the infections. °How is this treated? °Treatment for this condition often includes a combination of two or more of the following: °· Antibiotic medicine. °· Other medicines to treat less common causes of UTI. °· Over-the-counter medicines to treat  pain. °· Drinking enough water to stay hydrated. ° °Follow these instructions at home: °· Take over-the-counter and prescription medicines only as told by your health care provider. °· If you were prescribed an antibiotic, take it as told by your health care provider. Do not stop taking the antibiotic even if you start to feel better. °· Avoid alcohol, caffeine, tea, and carbonated beverages. They can irritate your bladder. °· Drink enough fluid to keep your urine clear or pale yellow. °· Keep all follow-up visits as told by your health care provider. This is important. °· Make sure to: °? Empty your bladder often and completely. Do not hold urine for long periods of time. °? Empty your bladder before and after sex. °? Wipe from front to back after a bowel movement if you are female. Use each tissue one time when you wipe. °Contact a health care provider if: °· You have back pain. °· You have a fever. °· You feel nauseous or vomit. °· Your symptoms do not get better after 3 days. °· Your symptoms go away and then return. °Get help right away if: °· You have severe back pain or lower abdominal pain. °· You are vomiting and cannot keep down any medicines or water. °This information is not intended to replace advice given to you by your health care provider. Make sure you discuss any questions you have with your health care provider. °Document Released: 11/05/2004 Document Revised: 07/10/2015 Document Reviewed: 12/17/2014 °Elsevier Interactive Patient Education © 2018 [employer] ° °

## 2017-03-17 NOTE — MAU Provider Note (Signed)
History     CSN: [id number]  Arrival date and time: 03/17/17 1331  Chief Complaint  Patient presents with  . Dysuria  . Hematuria  . Abdominal Pain   HPI Gail Stanley is a 27 y.o. [id number] non pregnant female who presents with lower abdominal pain and pain with urination for 3 days. She also reports frequency and urgency. She also reports pain in her lower abdomen. She denies any vaginal bleeding or discharge.   OB History    Gravida Para Term Preterm AB Living   3 2 2  0 1 2   SAB TAB Ectopic Multiple Live Births   1 0 0 0        Past Medical History:  Diagnosis Date  . Hypothyroidism     Past Surgical History:  Procedure Laterality Date  . ADENOIDECTOMY    . DILATION AND CURETTAGE OF UTERUS    . MYRINGOTOMY    . TONSILLECTOMY    . TUBAL LIGATION      Family History  Problem Relation Age of Onset  . Anesthesia problems Neg Hx     Social History   Tobacco Use  . Smoking status: Current Some Day Smoker    Packs/day: 0.50    Types: Cigarettes  . Smokeless tobacco: Never Used  Substance Use Topics  . Alcohol use: No  . Drug use: No    Allergies:  Allergies  Allergen Reactions  . Penicillins Anaphylaxis, Itching and Swelling    Throat swelling Has patient had a PCN reaction causing immediate rash, facial/tongue/throat swelling, SOB or lightheadedness with hypotension: Yes Has patient had a PCN reaction causing severe rash involving mucus membranes or skin necrosis: No Has patient had a PCN reaction that required hospitalization: No Has patient had a PCN reaction occurring within the last 10 years: No If all of the above answers are "NO", then may proceed with Cephalosporin use.     No medications prior to admission.    Review of Systems  Constitutional: Negative.  Negative for fatigue and fever.  HENT: Negative.   Respiratory: Negative.  Negative for shortness of breath and wheezing.   Cardiovascular: Negative.  Negative for chest pain.   Gastrointestinal: Positive for abdominal pain. Negative for constipation, diarrhea, nausea and vomiting.  Genitourinary: Positive for dysuria, frequency and hematuria. Negative for vaginal bleeding and vaginal discharge.  Neurological: Negative.  Negative for dizziness and headaches.   Physical Exam   Blood pressure 126/82, pulse 86, temperature 97.9 F (36.6 C), temperature source Oral, resp. rate 16, weight 281 lb (127.5 kg), last menstrual period 02/25/2017, SpO2 98 %.  Physical Exam  Nursing note and vitals reviewed. Constitutional: She is oriented to person, place, and time. She appears well-developed and well-nourished. No distress.  HENT:  Head: Normocephalic.  Eyes: Pupils are equal, round, and reactive to light.  Cardiovascular: Normal rate, regular rhythm and normal heart sounds.  Respiratory: Effort normal and breath sounds normal. No respiratory distress.  GI: Soft. Bowel sounds are normal. She exhibits no distension. There is no tenderness.  Neurological: She is alert and oriented to person, place, and time.  Skin: Skin is warm and dry.  Psychiatric: She has a normal mood and affect. Her behavior is normal. Judgment and thought content normal.    MAU Course  Procedures Results for orders placed or performed during the hospital encounter of 03/17/17 (from the past 24 hour(s))  Urinalysis, Routine w reflex microscopic     Status: Abnormal   Collection Time:  03/17/17  1:40 PM  Result Value Ref Range   Color, Urine YELLOW YELLOW   APPearance HAZY (A) CLEAR   Specific Gravity, Urine >1.030 (H) 1.005 - 1.030   pH 6.0 5.0 - 8.0   Glucose, UA NEGATIVE NEGATIVE mg/dL   Hgb urine dipstick LARGE (A) NEGATIVE   Bilirubin Urine NEGATIVE NEGATIVE   Ketones, ur NEGATIVE NEGATIVE mg/dL   Protein, ur 30 (A) NEGATIVE mg/dL   Nitrite POSITIVE (A) NEGATIVE   Leukocytes, UA SMALL (A) NEGATIVE  Urinalysis, Microscopic (reflex)     Status: Abnormal   Collection Time: 03/17/17  1:40  PM  Result Value Ref Range   RBC / HPF 0-5 0 - 5 RBC/hpf   WBC, UA 0-5 0 - 5 WBC/hpf   Bacteria, UA RARE (A) NONE SEEN   Squamous Epithelial / LPF 6-30 (A) NONE SEEN     MDM UA, UPT  Assessment and Plan   1. Acute cystitis with hematuria    -Discharge home in stable condition -Rx for macrobid and pyridium sent to patient's pharmacy -Pyelonephritis precautions discussed -Patient advised to follow-up with PCP for future urinary needs -Patient may return to MAU as needed or if her condition were to change or worsen  Rolm BookbinderCaroline M Neill CNM 03/17/2017, 2:32 PM

## 2017-03-18 LAB — URINE CULTURE: CULTURE: NO GROWTH

## 2017-07-02 ENCOUNTER — Emergency Department (HOSPITAL_COMMUNITY)
Admission: EM | Admit: 2017-07-02 | Discharge: 2017-07-02 | Disposition: A | Discharge status: Home / Self Care | Payer: Medicaid Other | Attending: Emergency Medicine | Admitting: Emergency Medicine

## 2017-07-02 ENCOUNTER — Encounter (HOSPITAL_COMMUNITY): Payer: Self-pay | Admitting: Emergency Medicine

## 2017-07-02 DIAGNOSIS — Y929 Unspecified place or not applicable: Secondary | ICD-10-CM | POA: Diagnosis not present

## 2017-07-02 DIAGNOSIS — T7840XA Allergy, unspecified, initial encounter: Secondary | ICD-10-CM

## 2017-07-02 DIAGNOSIS — R0602 Shortness of breath: Secondary | ICD-10-CM | POA: Diagnosis not present

## 2017-07-02 DIAGNOSIS — IMO0001 Reserved for inherently not codable concepts without codable children: Secondary | ICD-10-CM

## 2017-07-02 DIAGNOSIS — R42 Dizziness and giddiness: Secondary | ICD-10-CM | POA: Diagnosis not present

## 2017-07-02 DIAGNOSIS — X58XXXA Exposure to other specified factors, initial encounter: Secondary | ICD-10-CM | POA: Diagnosis not present

## 2017-07-02 DIAGNOSIS — T63441A Toxic effect of venom of bees, accidental (unintentional), initial encounter: Secondary | ICD-10-CM | POA: Diagnosis not present

## 2017-07-02 DIAGNOSIS — Y939 Activity, unspecified: Secondary | ICD-10-CM | POA: Insufficient documentation

## 2017-07-02 DIAGNOSIS — T63481A Toxic effect of venom of other arthropod, accidental (unintentional), initial encounter: Secondary | ICD-10-CM

## 2017-07-02 DIAGNOSIS — Y999 Unspecified external cause status: Secondary | ICD-10-CM | POA: Diagnosis not present

## 2017-07-02 DIAGNOSIS — F17210 Nicotine dependence, cigarettes, uncomplicated: Secondary | ICD-10-CM | POA: Diagnosis not present

## 2017-07-02 DIAGNOSIS — S20469A Insect bite (nonvenomous) of unspecified back wall of thorax, initial encounter: Secondary | ICD-10-CM | POA: Diagnosis present

## 2017-07-02 MED ORDER — EPINEPHRINE 0.3 MG/0.3ML IJ SOAJ: 0.3000 mg | Freq: Once | INTRAMUSCULAR | 0 refills | Status: AC

## 2017-07-02 MED ORDER — DIPHENHYDRAMINE HCL 25 MG PO CAPS
50.0000 mg | ORAL_CAPSULE | Freq: Once | ORAL | Status: AC
  Administered 2017-07-02: 50 mg via ORAL
  Filled 2017-07-02: qty 2

## 2017-07-02 MED ORDER — PREDNISONE 50 MG PO TABS: 50.0000 mg | ORAL_TABLET | Freq: Every day | ORAL | 0 refills | Status: AC

## 2017-07-02 MED ORDER — PREDNISONE 50 MG PO TABS
50.0000 mg | ORAL_TABLET | Freq: Once | ORAL | Status: AC
Start: 2017-07-02 — End: 2017-07-02
  Administered 2017-07-02: 50 mg via ORAL
  Filled 2017-07-02: qty 1

## 2017-07-02 NOTE — ED Triage Notes (Signed)
Pt repots that she got stung in her back and used her EPI pen. Reports that feels dizzy and SOB. Pt able to speak in full sentences.

## 2017-07-02 NOTE — ED Provider Notes (Signed)
[facility]-EMERGENCY DEPT Provider Note   CSN: [id number] Arrival date & time: 07/02/17  1519     History   Chief Complaint Chief Complaint  Patient presents with  . Allergic Reaction    HPI Gail Stanley is a 27 y.o. female.  The history is provided by the patient.  Allergic Reaction  Presenting symptoms: no difficulty breathing, no difficulty swallowing, no itching, no rash, no swelling and no wheezing   Severity:  Mild Duration:  2 hours Prior allergic episodes:  Insect allergies Context: insect bite/sting   Relieved by:  Epinephrine Worsened by:  Nothing Ineffective treatments:  None tried   Past Medical History:  Diagnosis Date  . Hypothyroidism     Patient Active Problem List   Diagnosis Date Noted  . Low grade squamous intraepithelial lesion (LGSIL) on Papanicolaou smear of cervix 03/24/2013  . OBESITY, NOS 04/08/2006  . TOBACCO USE, QUIT 04/08/2006    Past Surgical History:  Procedure Laterality Date  . ADENOIDECTOMY    . DILATION AND CURETTAGE OF UTERUS    . MYRINGOTOMY    . TONSILLECTOMY    . TUBAL LIGATION       OB History    Gravida  3   Para  2   Term  2   Preterm  0   AB  1   Living  2     SAB  1   TAB  0   Ectopic  0   Multiple  0   Live Births               Home Medications    Prior to Admission medications   Medication Sig Start Date End Date Taking? Authorizing Provider  nitrofurantoin, macrocrystal-monohydrate, (MACROBID) 100 MG capsule Take 1 capsule (100 mg total) by mouth 2 (two) times daily. Patient not taking: Reported on 07/02/2017 03/17/17   Rolm Bookbinder, CNM  phenazopyridine (PYRIDIUM) 200 MG tablet Take 1 tablet (200 mg total) by mouth 3 (three) times daily. Patient not taking: Reported on 07/02/2017 03/17/17   Rolm Bookbinder, CNM    Family History Family History  Problem Relation Age of Onset  . Anesthesia problems Neg Hx     Social History Social History   Tobacco  Use  . Smoking status: Current Some Day Smoker    Packs/day: 0.50    Types: Cigarettes  . Smokeless tobacco: Never Used  Substance Use Topics  . Alcohol use: No  . Drug use: No     Allergies   Penicillins   Review of Systems Review of Systems  Constitutional: Negative for chills, fatigue and fever.  HENT: Negative for congestion and trouble swallowing.   Eyes: Negative for visual disturbance.  Respiratory: Negative for cough, choking, chest tightness, shortness of breath, wheezing and stridor.   Cardiovascular: Negative for chest pain.  Gastrointestinal: Negative for abdominal pain, constipation, diarrhea, nausea and vomiting.  Genitourinary: Negative for enuresis, flank pain and frequency.  Musculoskeletal: Negative for back pain.  Skin: Negative for itching, rash and wound.  Neurological: Negative for dizziness, light-headedness, numbness and headaches.  Psychiatric/Behavioral: Negative for confusion.  All other systems reviewed and are negative.    Physical Exam Updated Vital Signs BP 134/85 (BP Location: Left Arm)   Pulse 85   Temp 98.3 F (36.8 C) (Oral)   Resp 19   Ht  (1.803 m)   Wt 127 kg (280 lb) [id number]/16/2019   SpO2 97%  BMI 39.05 kg/m   Physical Exam  Constitutional: She is oriented to person, place, and time. She appears well-developed and well-nourished. No distress.  HENT:  Head: Normocephalic and atraumatic.  Nose: Nose normal.  Mouth/Throat: Oropharynx is clear and moist. No oropharyngeal exudate.  Eyes: Pupils are equal, round, and reactive to light. Conjunctivae and EOM are normal.  Neck: Normal range of motion. Neck supple.  Cardiovascular: Normal rate and intact distal pulses.  No murmur heard. Pulmonary/Chest: Effort normal. No stridor. No respiratory distress. She has no wheezes. She has no rales. She exhibits no tenderness.  Abdominal: Soft. She exhibits no distension. There is no tenderness.  Musculoskeletal: She exhibits no  edema or tenderness.  Neurological: She is alert and oriented to person, place, and time. No sensory deficit. She exhibits normal muscle tone.  Skin: Capillary refill takes less than 2 seconds. No rash noted. She is not diaphoretic. No erythema.  Psychiatric: She has a normal mood and affect.  Nursing note and vitals reviewed.    ED Treatments / Results  Labs (all labs ordered are listed, but only abnormal results are displayed) Labs Reviewed - No data to display  EKG None  Radiology No results found.  Procedures Procedures (including critical care time)  Medications Ordered in ED Medications  diphenhydrAMINE (BENADRYL) capsule 50 mg (50 mg Oral Given 07/02/17 1712)  predniSONE (DELTASONE) tablet 50 mg (50 mg Oral Given 07/02/17 1712)     Initial Impression / Assessment and Plan / ED Course  I have reviewed the triage vital signs and the nursing notes.  Pertinent labs & imaging results that were available during my care of the patient were reviewed by me and considered in my medical decision making (see chart for details).     Gail Stanley is a 27 y.o. female with a past medical history significant for hypertension and be allergy who presents with bee sting with allergic reaction and epinephrine use.  Patient says that today, 245 she was stung by a bee on her left shoulder while in her car.  Due to a history of anaphylaxis with throat swelling years ago, patient quickly gave herself an EpiPen.  She says it was around 2:45 PM that she gave the medication.  She does not falling and she feels very jittery and anxious and had some shortness of breath but she says that that has completely resolved after several minutes.  She denies persistent rash, nausea, vomiting, stridor, sore throat, face, lips, tongue, or throat swelling.  She denies any abdominal pain or syncope.  She reports feeling normal now.  She denies any pains.  She denies recent allergic reaction or any other physical  complaints.  Next  On exam, no stridor appreciated.  Lungs were clear with no wheezing.  Oropharyngeal exam unremarkable.  No rash seen on exam.  Abdomen nontender and patient otherwise appears well.  Given the epinephrine use, patient will be observed for several hours.  Patient was given Benadryl and prednisone.  Next  If patient has no symptoms on reassessment, patient will be stable for discharge home with prescription for prednisone, instructions for taking Benadryl, and EpiPen refill.  Anticipate reassessment with likely discharge.      6:26 PM Patient had several hours of no reaction.  Patient tolerated medications well.  Patient will give prescription for epinephrine as well as instructions to take Benadryl and prednisone.  Patient will follow-up with PCP for further management.  Patient had no other questions or concerns and  was discharged in good condition with no further evidence of reaction.   Final Clinical Impressions(s) / ED Diagnoses   Final diagnoses:  Allergic reaction, initial encounter  Hymenoptera sting, accidental or unintentional, initial encounter    ED Discharge Orders        Ordered    EPINEPHrine 0.3 mg/0.3 mL IJ SOAJ injection   Once     07/02/17 1823    predniSONE (DELTASONE) 50 MG tablet  Daily     07/02/17 1823      Clinical Impression: 1. Allergic reaction, initial encounter   2. Hymenoptera sting, accidental or unintentional, initial encounter     Disposition: Discharge  Condition: Good  I have discussed the results, Dx and Tx plan with the pt(& family if present). He/she/they expressed understanding and agree(s) with the plan. Discharge instructions discussed at great length. Strict return precautions discussed and pt &/or family have verbalized understanding of the instructions. No further questions at time of discharge.    New Prescriptions   EPINEPHRINE 0.3 MG/0.3 ML IJ SOAJ INJECTION    Inject 0.3 mLs (0.3 mg total) into the muscle once  for 1 dose.   PREDNISONE (DELTASONE) 50 MG TABLET    Take 1 tablet (50 mg total) by mouth daily for 5 days.    Follow Up: [facility] [address] [place] [address] [phone] Schedule an appointment as soon as possible for a visit    [facility]-EMERGENCY DEPT [address] [id number] [place] [address] [phone]       Tegeler, Canary Brim, MD 07/03/17 [phone]

## 2017-07-02 NOTE — Discharge Instructions (Signed)
You demonstrated several hours of stability after your epinephrine and you did not have a rebound reaction.  We feel you are safe for discharge home with the steroids and Benadryl with the EpiPen prescription.  Please follow-up with your PCP for further management.  If any symptoms change or worsen, please return to the nearest emergency department.

## 2017-07-29 ENCOUNTER — Other Ambulatory Visit: Payer: Self-pay

## 2017-07-29 ENCOUNTER — Encounter (HOSPITAL_BASED_OUTPATIENT_CLINIC_OR_DEPARTMENT_OTHER): Payer: Self-pay

## 2017-07-29 ENCOUNTER — Emergency Department (HOSPITAL_BASED_OUTPATIENT_CLINIC_OR_DEPARTMENT_OTHER)
Admission: EM | Admit: 2017-07-29 | Discharge: 2017-07-29 | Disposition: A | Discharge status: Home / Self Care | Payer: Medicaid Other | Attending: Emergency Medicine | Admitting: Emergency Medicine

## 2017-07-29 ENCOUNTER — Emergency Department (HOSPITAL_BASED_OUTPATIENT_CLINIC_OR_DEPARTMENT_OTHER): Payer: Medicaid Other

## 2017-07-29 DIAGNOSIS — W25XXXA Contact with sharp glass, initial encounter: Secondary | ICD-10-CM | POA: Diagnosis not present

## 2017-07-29 DIAGNOSIS — S91331A Puncture wound without foreign body, right foot, initial encounter: Secondary | ICD-10-CM | POA: Insufficient documentation

## 2017-07-29 DIAGNOSIS — Y939 Activity, unspecified: Secondary | ICD-10-CM | POA: Diagnosis not present

## 2017-07-29 DIAGNOSIS — Y929 Unspecified place or not applicable: Secondary | ICD-10-CM | POA: Diagnosis not present

## 2017-07-29 DIAGNOSIS — L02611 Cutaneous abscess of right foot: Secondary | ICD-10-CM | POA: Diagnosis not present

## 2017-07-29 DIAGNOSIS — Z23 Encounter for immunization: Secondary | ICD-10-CM | POA: Insufficient documentation

## 2017-07-29 DIAGNOSIS — E039 Hypothyroidism, unspecified: Secondary | ICD-10-CM | POA: Diagnosis not present

## 2017-07-29 DIAGNOSIS — Y998 Other external cause status: Secondary | ICD-10-CM | POA: Insufficient documentation

## 2017-07-29 DIAGNOSIS — L089 Local infection of the skin and subcutaneous tissue, unspecified: Secondary | ICD-10-CM

## 2017-07-29 DIAGNOSIS — F17210 Nicotine dependence, cigarettes, uncomplicated: Secondary | ICD-10-CM | POA: Insufficient documentation

## 2017-07-29 MED ORDER — TETANUS-DIPHTH-ACELL PERTUSSIS 5-2.5-18.5 LF-MCG/0.5 IM SUSP
0.5000 mL | Freq: Once | INTRAMUSCULAR | Status: AC
  Administered 2017-07-29: 0.5 mL via INTRAMUSCULAR
  Filled 2017-07-29: qty 0.5

## 2017-07-29 MED ORDER — DOXYCYCLINE HYCLATE 100 MG PO CAPS: 100.0000 mg | ORAL_CAPSULE | Freq: Two times a day (BID) | ORAL | 0 refills | Status: AC

## 2017-07-29 MED ORDER — DOXYCYCLINE HYCLATE 100 MG PO TABS
100.0000 mg | ORAL_TABLET | Freq: Once | ORAL | Status: AC
  Administered 2017-07-29: 100 mg via ORAL
  Filled 2017-07-29: qty 1

## 2017-07-29 NOTE — ED Provider Notes (Signed)
[facility] EMERGENCY DEPARTMENT Provider Note   CSN: [id number] Arrival date & time: 07/29/17  0025     History   Chief Complaint Chief Complaint  Patient presents with  . Foot Pain    HPI Gail Igoikki D Stanley is a 27 y.o. female.  Patient is a 27 year old female with no significant past medical history.  She presents with right foot pain.  She reports stepping on a piece of broken glass several days ago.  She removed a piece of glass from her foot.  Since that time she has developed increased pain, swelling, and a blister to the bottom of the foot.  The history is provided by the patient.  Foot Pain  This is a new problem. Episode onset: Several days ago. The problem occurs constantly. The problem has been rapidly worsening. The symptoms are aggravated by walking.    Past Medical History:  Diagnosis Date  . Hypothyroidism     Patient Active Problem List   Diagnosis Date Noted  . Low grade squamous intraepithelial lesion (LGSIL) on Papanicolaou smear of cervix 03/24/2013  . OBESITY, NOS 04/08/2006  . TOBACCO USE, QUIT 04/08/2006    Past Surgical History:  Procedure Laterality Date  . ADENOIDECTOMY    . DILATION AND CURETTAGE OF UTERUS    . MYRINGOTOMY    . TONSILLECTOMY    . TUBAL LIGATION       OB History    Gravida  3   Para  2   Term  2   Preterm  0   AB  1   Living  2     SAB  1   TAB  0   Ectopic  0   Multiple  0   Live Births               Home Medications    Prior to Admission medications   Medication Sig Start Date End Date Taking? Authorizing Provider  nitrofurantoin, macrocrystal-monohydrate, (MACROBID) 100 MG capsule Take 1 capsule (100 mg total) by mouth 2 (two) times daily. Patient not taking: Reported on 07/02/2017 03/17/17   Rolm BookbinderNeill, Caroline M, CNM  phenazopyridine (PYRIDIUM) 200 MG tablet Take 1 tablet (200 mg total) by mouth 3 (three) times daily. Patient not taking: Reported on 07/02/2017 03/17/17   Rolm BookbinderNeill, Caroline M,  CNM    Family History Family History  Problem Relation Age of Onset  . Anesthesia problems Neg Hx     Social History Social History   Tobacco Use  . Smoking status: Current Some Day Smoker    Packs/day: 0.50    Types: Cigarettes  . Smokeless tobacco: Never Used  Substance Use Topics  . Alcohol use: No  . Drug use: No     Allergies   Bee venom and Penicillins   Review of Systems Review of Systems  All other systems reviewed and are negative.    Physical Exam Updated Vital Signs BP 111/80 (BP Location: Right Arm)   Pulse 84   Temp 99.3 F (37.4 C) (Oral)   Resp 16   Ht 5\' 11"  (1.803 m)   Wt 127 kg (280 lb)   LMP 06/25/2017 Comment: tubal ligation  SpO2 99%   BMI 39.05 kg/m   Physical Exam  Constitutional: She appears well-developed and well-nourished. No distress.  HENT:  Head: Normocephalic and atraumatic.  Neck: Normal range of motion. Neck supple.  Skin: Skin is warm and dry. She is not diaphoretic.  The bottom of the right foot  in the area of the distal fourth metatarsal on the plantar surface is a 1 cm x 1.5 cm round, blister with purulent material underneath.  Nursing note and vitals reviewed.    ED Treatments / Results  Labs (all labs ordered are listed, but only abnormal results are displayed) Labs Reviewed - No data to display  EKG None  Radiology Dg Foot Complete Right  Result Date: 07/29/2017 CLINICAL DATA:  27 y/o F; right foot pain after stepping on a piece of glass. Puncture wound between the fourth and fifth metatarsal heads. EXAM: RIGHT FOOT COMPLETE - 3+ VIEW COMPARISON:  None. FINDINGS: There is no evidence of fracture or dislocation. There is no evidence of arthropathy or other focal bone abnormality. No radiopaque foreign body identified. IMPRESSION: Negative.  No radiopaque foreign body identified. Electronically Signed   By: Mitzi Hansen M.D.   On: 07/29/2017 01:20    Procedures Procedures (including critical  care time)  Medications Ordered in ED Medications  doxycycline (VIBRA-TABS) tablet 100 mg (has no administration in time range)  Tdap (BOOSTRIX) injection 0.5 mL (has no administration in time range)     Initial Impression / Assessment and Plan / ED Course  I have reviewed the triage vital signs and the nursing notes.  Pertinent labs & imaging results that were available during my care of the patient were reviewed by me and considered in my medical decision making (see chart for details).  This area was incised and a moderate quantity of purulent material was expressed.  I am unable to palpate a foreign body and I see no evidence for foreign body on x-ray.  She will be treated with doxycycline, warm soaks, and follow-up as needed if not improving.  Final Clinical Impressions(s) / ED Diagnoses   Final diagnoses:  None    ED Discharge Orders    None       Geoffery Lyons, MD 07/29/17 [phone]

## 2017-07-29 NOTE — Discharge Instructions (Addendum)
Doxycycline as prescribed.  Warm soaks as frequently as possible for the next several days.  Return to the emergency department for increased redness, increased pain, or other new and concerning symptoms.

## 2017-07-29 NOTE — ED Triage Notes (Signed)
Right foot pain after stepping on a piece of glass three weeks ago, noticed swelling, pain and discoloration to bottom of foot about a week ago, no fevers at home

## 2017-08-11 ENCOUNTER — Encounter: Payer: Medicaid Other | Admitting: Podiatry

## 2017-08-17 NOTE — Progress Notes (Signed)
This encounter was created in error - please disregard.

## 2018-07-03 ENCOUNTER — Emergency Department (HOSPITAL_COMMUNITY)
Admission: EM | Admit: 2018-07-03 | Discharge: 2018-07-03 | Disposition: A | Discharge status: Home / Self Care | Payer: Medicaid Other | Attending: Emergency Medicine | Admitting: Emergency Medicine

## 2018-07-03 ENCOUNTER — Other Ambulatory Visit: Payer: Self-pay

## 2018-07-03 ENCOUNTER — Encounter (HOSPITAL_COMMUNITY): Payer: Self-pay

## 2018-07-03 DIAGNOSIS — M79621 Pain in right upper arm: Secondary | ICD-10-CM | POA: Diagnosis present

## 2018-07-03 DIAGNOSIS — F17210 Nicotine dependence, cigarettes, uncomplicated: Secondary | ICD-10-CM | POA: Diagnosis not present

## 2018-07-03 DIAGNOSIS — E039 Hypothyroidism, unspecified: Secondary | ICD-10-CM | POA: Diagnosis not present

## 2018-07-03 DIAGNOSIS — Z79899 Other long term (current) drug therapy: Secondary | ICD-10-CM | POA: Diagnosis not present

## 2018-07-03 DIAGNOSIS — L731 Pseudofolliculitis barbae: Secondary | ICD-10-CM

## 2018-07-03 MED ORDER — LIDOCAINE HCL 2 % IJ SOLN: 15.0000 mL | Freq: Once | INTRAMUSCULAR | Status: DC

## 2018-07-03 NOTE — ED Provider Notes (Signed)
[facility]-EMERGENCY DEPT Provider Note   CSN: [id number] Arrival date & time: 07/03/18  1829    History   Chief Complaint Chief Complaint  Patient presents with  . Abscess    HPI CHERISE FEDDER is a 28 y.o. female.     HPI  28 year old female presenting emergency department today for evaluation of elevated temperature at work.  States she went to work today and had a temporal temperature of 99.9 Fahrenheit.  She was advised by her job to have a medical evaluation.  Patient denies any infectious symptoms recently including no known fevers at home.  No rhinorrhea, congestion, sore throat cough shortness of breath chest pain belly pain nausea vomiting diarrhea or urinary complaints.  States that she has had some infected hairs in her right axilla a while back that have since been resolved, she wonders if this is what could have caused her temperature to be elevated.  She denies any pain to the right axilla.  No drainage.  No significant swelling.  Triage note indicates that patient currently has abscesses in the groin area however patient declines this on my evaluation and states that she did have these but it was several weeks ago and they have since resolved.  Past Medical History:  Diagnosis Date  . Hypothyroidism     Patient Active Problem List   Diagnosis Date Noted  . Low grade squamous intraepithelial lesion (LGSIL) on Papanicolaou smear of cervix 03/24/2013  . OBESITY, NOS 04/08/2006  . TOBACCO USE, QUIT 04/08/2006    Past Surgical History:  Procedure Laterality Date  . ADENOIDECTOMY    . DILATION AND CURETTAGE OF UTERUS    . MYRINGOTOMY    . TONSILLECTOMY    . TUBAL LIGATION       OB History    Gravida  3   Para  2   Term  2   Preterm  0   AB  1   Living  2     SAB  1   TAB  0   Ectopic  0   Multiple  0   Live Births               Home Medications    Prior to Admission medications   Medication Sig Start Date End  Date Taking? Authorizing Provider  doxycycline (VIBRAMYCIN) 100 MG capsule Take 1 capsule (100 mg total) by mouth 2 (two) times daily. 07/29/17   Geoffery Lyons, MD  nitrofurantoin, macrocrystal-monohydrate, (MACROBID) 100 MG capsule Take 1 capsule (100 mg total) by mouth 2 (two) times daily. Patient not taking: Reported on 07/02/2017 03/17/17   Rolm Bookbinder, CNM  phenazopyridine (PYRIDIUM) 200 MG tablet Take 1 tablet (200 mg total) by mouth 3 (three) times daily. Patient not taking: Reported on 07/02/2017 03/17/17   Rolm Bookbinder, CNM    Family History Family History  Problem Relation Age of Onset  . Anesthesia problems Neg Hx     Social History Social History   Tobacco Use  . Smoking status: Current Some Day Smoker    Packs/day: 0.50    Types: Cigarettes  . Smokeless tobacco: Never Used  Substance Use Topics  . Alcohol use: No  . Drug use: No     Allergies   Bee venom and Penicillins   Review of Systems Review of Systems   Physical Exam Updated Vital Signs BP (!) 127/92 (BP Location: Left Arm)   Pulse 90   Temp 98.6 F (37  C) (Oral)   Resp 16   Ht 5\' 11"  (1.803 m)   Wt 113.4 kg   LMP 06/11/2018   SpO2 97%   BMI 34.87 kg/m   Physical Exam Vitals signs and nursing note reviewed.  Constitutional:      General: She is not in acute distress.    Appearance: She is well-developed.  HENT:     Head: Normocephalic and atraumatic.     Mouth/Throat:     Mouth: Mucous membranes are moist.     Pharynx: No oropharyngeal exudate or posterior oropharyngeal erythema.  Eyes:     Conjunctiva/sclera: Conjunctivae normal.  Neck:     Musculoskeletal: Neck supple.  Cardiovascular:     Rate and Rhythm: Normal rate and regular rhythm.     Heart sounds: Normal heart sounds.  Pulmonary:     Effort: Pulmonary effort is normal.     Breath sounds: Normal breath sounds.  Abdominal:     General: Bowel sounds are normal.     Palpations: Abdomen is soft.  Musculoskeletal:  Normal range of motion.     Comments: Two <0.5 areas of erythema to the right axilla that are nontender.  The skin is somewhat clouded but it does not feel fluctuant.  Areas are nontender.  Skin:    General: Skin is warm and dry.  Neurological:     Mental Status: She is alert.      ED Treatments / Results  Labs (all labs ordered are listed, but only abnormal results are displayed) Labs Reviewed - No data to display  EKG None  Radiology No results found.  Procedures Procedures (including critical care time)  Medications Ordered in ED Medications - No data to display   Initial Impression / Assessment and Plan / ED Course  I have reviewed the triage vital signs and the nursing notes.  Pertinent labs & imaging results that were available during my care of the patient were reviewed by me and considered in my medical decision making (see chart for details).      Final Clinical Impressions(s) / ED Diagnoses   Final diagnoses:  Ingrown hair   28 year old female presenting emergency department today for evaluation of elevated temperature at work.  States she went to work today and had a temporal temperature of 99.9 Fahrenheit.  She was advised by her job to have a medical evaluation.  Patient denies any infectious symptoms recently including no known fevers at home.  No rhinorrhea, congestion, sore throat cough shortness of breath chest pain belly pain nausea vomiting diarrhea or urinary complaints.  States that she has had some infected hairs in her right axilla a while back that have since been resolved, she wonders if this is what could have caused her temperature to be elevated.  She denies any pain to the right axilla.  No drainage.  No significant swelling.  Triage note indicates that patient currently has abscesses in the groin area however patient declines this on my evaluation and states that she did have these but it was several weeks ago and they have since resolved.   Patient has 2 less than 0.5cm areas of mild erythema to the right axilla that are nontender.  No fluctuance noted.  Consistent with prior ingrown hair has now resolved.  No evidence of abscess or cellulitis.  Low suspicion for infection that would require any further treatment or intervention.  Patient appropriate for discharge.  Advised return if worse.  ED Discharge Orders    None  Karrie MeresCouture, Antoney Biven S, PA-C 07/03/18 2308    Shaune PollackIsaacs, [place], MD 07/04/18 1040

## 2018-07-03 NOTE — Discharge Instructions (Signed)
Please follow up with your primary care provider within 5-7 days for re-evaluation of your symptoms. If you do not have a primary care provider, information for a healthcare clinic has been provided for you to make arrangements for follow up care. Please return to the emergency department for any new or worsening symptoms. ° °

## 2018-07-03 NOTE — ED Triage Notes (Signed)
Pt states she has been having knots and numbness under her right axilla. Pt states that she has also had some in groin area. Pt states this has caused her to run a mild fever, and her employer will not let her work.  Fever was 99.9 temporal at work.

## 2018-12-14 IMAGING — DX DG FOOT COMPLETE 3+V*R*
3 series · 3 of 3 positions shown · non-contrast
Comparison: None.

CLINICAL DATA: 26 y/o F; right foot pain after stepping on a piece
of glass. Puncture wound between the fourth and fifth metatarsal
heads.

EXAM:
RIGHT FOOT COMPLETE - 3+ VIEW

[foot ap]
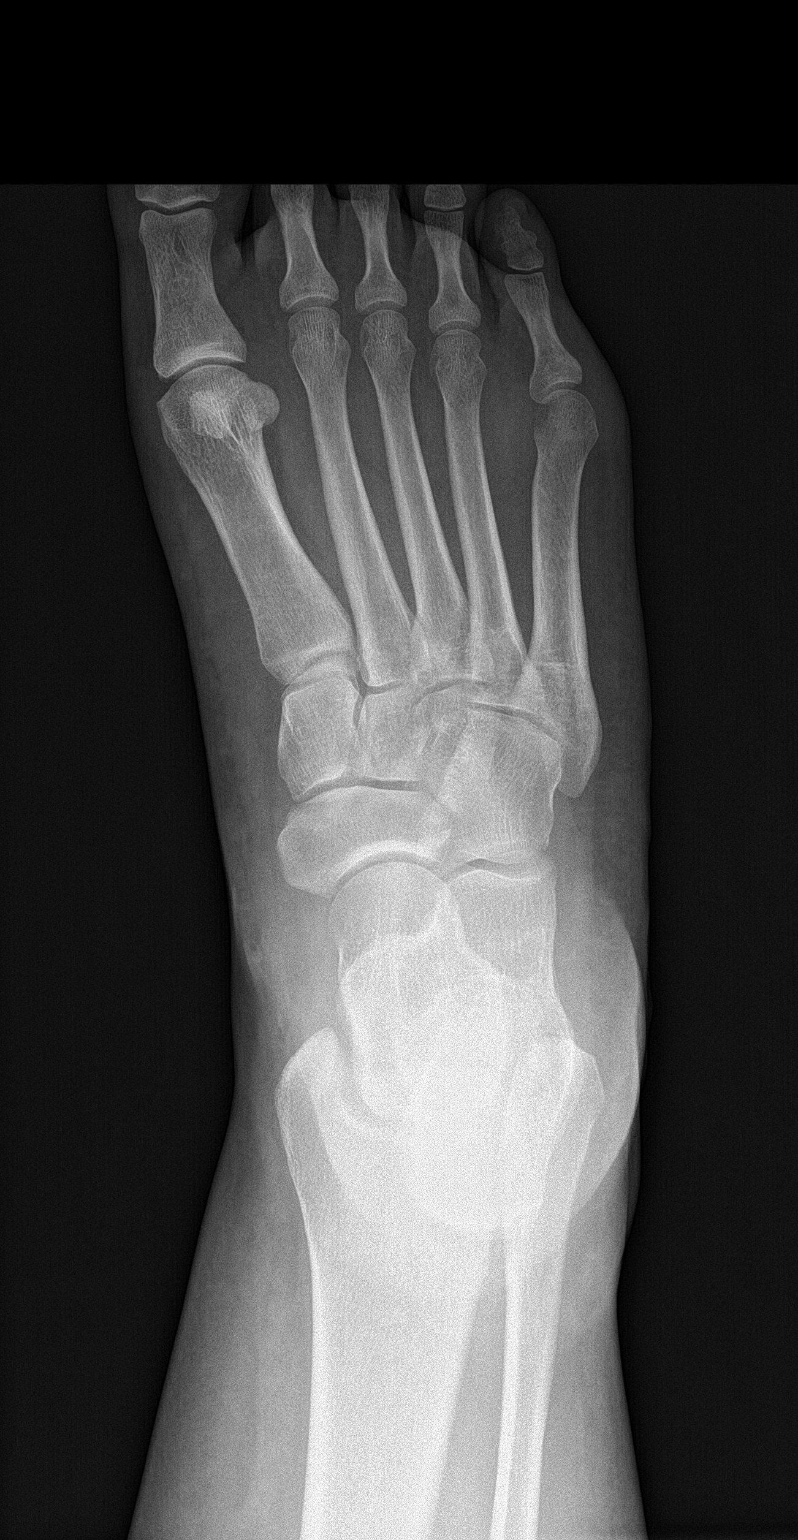

[foot obl]
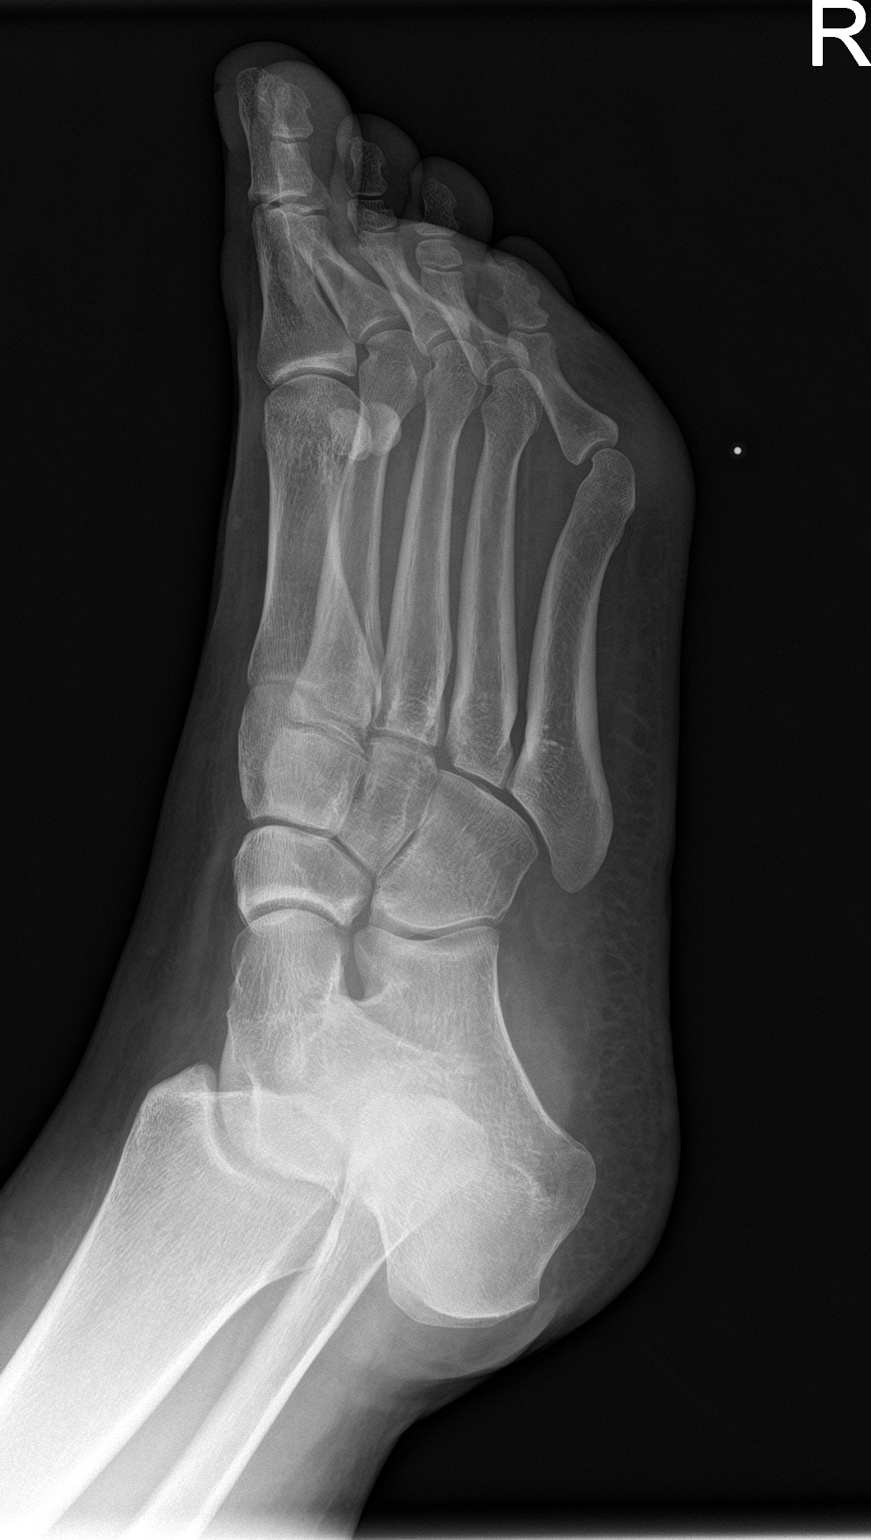

[foot lat]
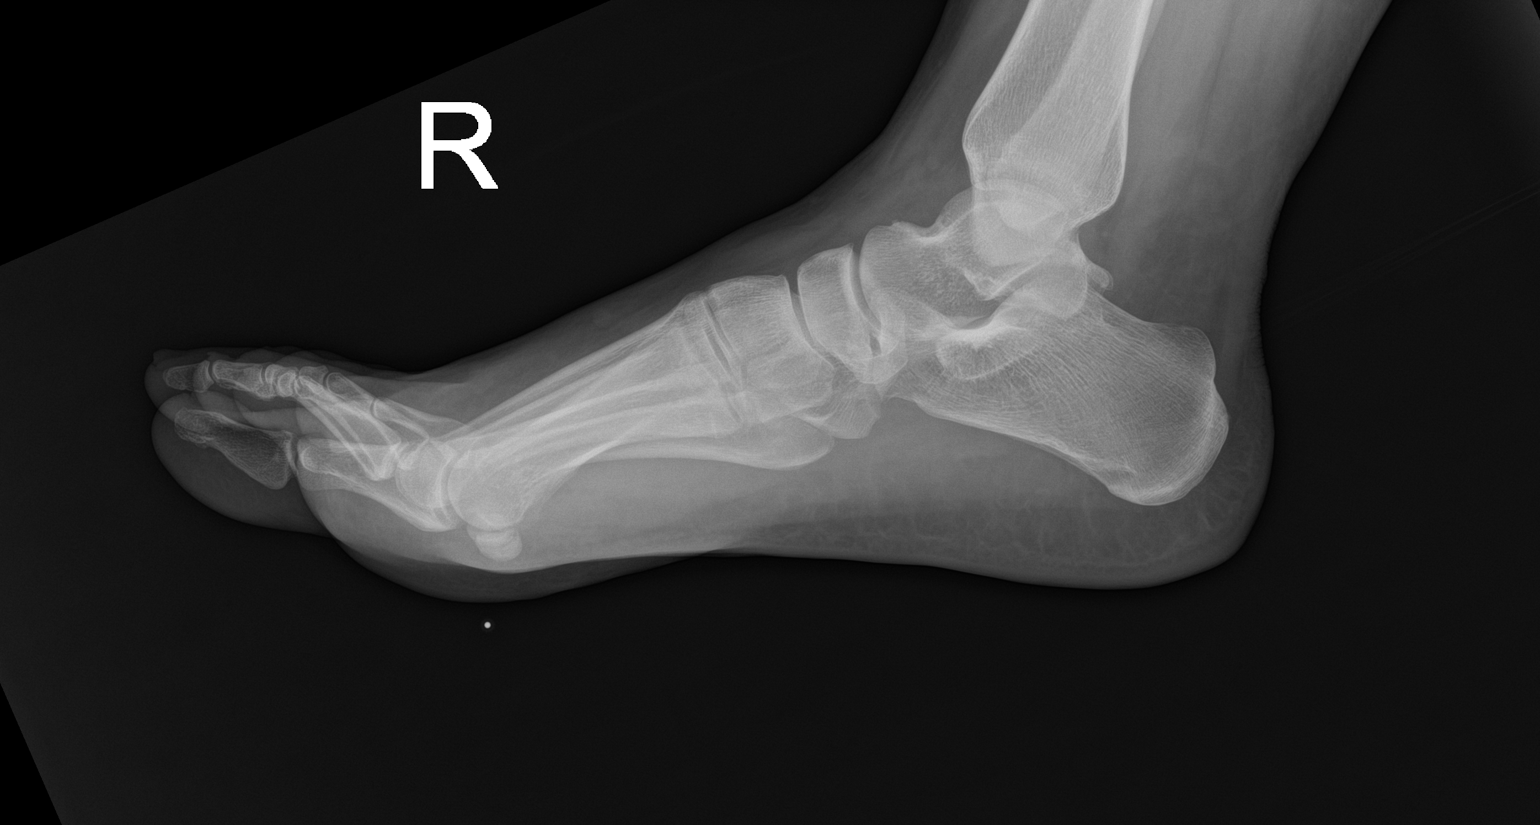

[3 of 3 positions shown; findings below may reference images not displayed]

FINDINGS: There is no evidence of fracture or dislocation. There is no
evidence of arthropathy or other focal bone abnormality. No
radiopaque foreign body identified.
IMPRESSION: Negative.  No radiopaque foreign body identified.

By: Felner Ceola M.D.

## 2020-05-07 ENCOUNTER — Other Ambulatory Visit: Payer: Self-pay

## 2020-05-07 ENCOUNTER — Emergency Department (HOSPITAL_COMMUNITY): Payer: Medicaid Other

## 2020-05-07 ENCOUNTER — Encounter (HOSPITAL_COMMUNITY): Payer: Self-pay | Admitting: Emergency Medicine

## 2020-05-07 ENCOUNTER — Emergency Department (HOSPITAL_COMMUNITY)
Admission: EM | Admit: 2020-05-07 | Discharge: 2020-05-07 | Disposition: A | Discharge status: Home / Self Care | Payer: Medicaid Other | Attending: Emergency Medicine | Admitting: Emergency Medicine

## 2020-05-07 DIAGNOSIS — W19XXXA Unspecified fall, initial encounter: Secondary | ICD-10-CM | POA: Insufficient documentation

## 2020-05-07 DIAGNOSIS — R531 Weakness: Secondary | ICD-10-CM | POA: Insufficient documentation

## 2020-05-07 DIAGNOSIS — F17210 Nicotine dependence, cigarettes, uncomplicated: Secondary | ICD-10-CM | POA: Diagnosis not present

## 2020-05-07 DIAGNOSIS — Y92009 Unspecified place in unspecified non-institutional (private) residence as the place of occurrence of the external cause: Secondary | ICD-10-CM | POA: Diagnosis not present

## 2020-05-07 DIAGNOSIS — R5383 Other fatigue: Secondary | ICD-10-CM | POA: Diagnosis not present

## 2020-05-07 DIAGNOSIS — R42 Dizziness and giddiness: Secondary | ICD-10-CM | POA: Diagnosis present

## 2020-05-07 DIAGNOSIS — Y9301 Activity, walking, marching and hiking: Secondary | ICD-10-CM | POA: Diagnosis not present

## 2020-05-07 DIAGNOSIS — R270 Ataxia, unspecified: Secondary | ICD-10-CM

## 2020-05-07 DIAGNOSIS — Z20822 Contact with and (suspected) exposure to covid-19: Secondary | ICD-10-CM | POA: Diagnosis not present

## 2020-05-07 DIAGNOSIS — G379 Demyelinating disease of central nervous system, unspecified: Secondary | ICD-10-CM

## 2020-05-07 DIAGNOSIS — E039 Hypothyroidism, unspecified: Secondary | ICD-10-CM | POA: Insufficient documentation

## 2020-05-07 DIAGNOSIS — I639 Cerebral infarction, unspecified: Secondary | ICD-10-CM | POA: Diagnosis not present

## 2020-05-07 LAB — I-STAT CHEM 8, ED
BUN: 11 mg/dL (ref 6–20)
Calcium, Ion: 1.16 mmol/L (ref 1.15–1.40)
Chloride: 105 mmol/L (ref 98–111)
Creatinine, Ser: 0.6 mg/dL (ref 0.44–1.00)
Glucose, Bld: 112 mg/dL — ABNORMAL HIGH (ref 70–99)
HCT: 45 % (ref 36.0–46.0)
Hemoglobin: 15.3 g/dL — ABNORMAL HIGH (ref 12.0–15.0)
Potassium: 4.1 mmol/L (ref 3.5–5.1)
Sodium: 141 mmol/L (ref 135–145)
TCO2: 25 mmol/L (ref 22–32)

## 2020-05-07 LAB — DIFFERENTIAL
Abs Immature Granulocytes: 0.03 10*3/uL (ref 0.00–0.07)
Basophils Absolute: 0 10*3/uL (ref 0.0–0.1)
Basophils Relative: 0 %
Eosinophils Absolute: 0.1 10*3/uL (ref 0.0–0.5)
Eosinophils Relative: 2 %
Immature Granulocytes: 0 %
Lymphocytes Relative: 24 %
Lymphs Abs: 1.9 10*3/uL (ref 0.7–4.0)
Monocytes Absolute: 0.4 10*3/uL (ref 0.1–1.0)
Monocytes Relative: 5 %
Neutro Abs: 5.5 10*3/uL (ref 1.7–7.7)
Neutrophils Relative %: 69 %

## 2020-05-07 LAB — COMPREHENSIVE METABOLIC PANEL
ALT: 28 U/L (ref 0–44)
AST: 34 U/L (ref 15–41)
Albumin: 3.9 g/dL (ref 3.5–5.0)
Alkaline Phosphatase: 51 U/L (ref 38–126)
Anion gap: 7 (ref 5–15)
BUN: 10 mg/dL (ref 6–20)
CO2: 25 mmol/L (ref 22–32)
Calcium: 9.2 mg/dL (ref 8.9–10.3)
Chloride: 106 mmol/L (ref 98–111)
Creatinine, Ser: 0.73 mg/dL (ref 0.44–1.00)
GFR, Estimated: >60 mL/min (ref >60)
Glucose, Bld: 114 mg/dL — ABNORMAL HIGH (ref 70–99)
Potassium: 4.4 mmol/L (ref 3.5–5.1)
Sodium: 138 mmol/L (ref 135–145)
Total Bilirubin: 1.2 mg/dL (ref 0.3–1.2)
Total Protein: 6.9 g/dL (ref 6.5–8.1)

## 2020-05-07 LAB — URINALYSIS, ROUTINE W REFLEX MICROSCOPIC
Bilirubin Urine: NEGATIVE
Glucose, UA: NEGATIVE mg/dL
Hgb urine dipstick: NEGATIVE
Ketones, ur: NEGATIVE mg/dL
Leukocytes,Ua: NEGATIVE
Nitrite: NEGATIVE
Protein, ur: NEGATIVE mg/dL
Specific Gravity, Urine: 1.034 — ABNORMAL HIGH (ref 1.005–1.030)
pH: 6 (ref 5.0–8.0)

## 2020-05-07 LAB — I-STAT BETA HCG BLOOD, ED (MC, WL, AP ONLY): I-stat hCG, quantitative: <5 m[IU]/mL (ref <5)

## 2020-05-07 LAB — LIPID PANEL
Cholesterol: 166 mg/dL (ref 0–200)
HDL: 40 mg/dL — ABNORMAL LOW (ref >40)
LDL Cholesterol: 102 mg/dL — ABNORMAL HIGH (ref 0–99)
Total CHOL/HDL Ratio: 4.2 RATIO
Triglycerides: 118 mg/dL (ref <150)
VLDL: 24 mg/dL (ref 0–40)

## 2020-05-07 LAB — CBC
HCT: 42.6 % (ref 36.0–46.0)
Hemoglobin: 14.5 g/dL (ref 12.0–15.0)
MCH: 31.7 pg (ref 26.0–34.0)
MCHC: 34 g/dL (ref 30.0–36.0)
MCV: 93 fL (ref 80.0–100.0)
Platelets: 250 10*3/uL (ref 150–400)
RBC: 4.58 MIL/uL (ref 3.87–5.11)
RDW: 13 % (ref 11.5–15.5)
WBC: 8 10*3/uL (ref 4.0–10.5)
nRBC: 0 % (ref 0.0–0.2)

## 2020-05-07 LAB — PROTIME-INR
INR: 1 (ref 0.8–1.2)
Prothrombin Time: 12.4 seconds (ref 11.4–15.2)

## 2020-05-07 LAB — HEMOGLOBIN A1C
Hgb A1c MFr Bld: 5.7 % — ABNORMAL HIGH (ref 4.8–5.6)
Mean Plasma Glucose: 116.89 mg/dL

## 2020-05-07 LAB — RESP PANEL BY RT-PCR (FLU A&B, COVID) ARPGX2
Influenza A by PCR: NEGATIVE
Influenza B by PCR: NEGATIVE
SARS Coronavirus 2 by RT PCR: NEGATIVE

## 2020-05-07 LAB — CBG MONITORING, ED: Glucose-Capillary: 113 mg/dL — ABNORMAL HIGH (ref 70–99)

## 2020-05-07 LAB — APTT: aPTT: 29 seconds (ref 24–36)

## 2020-05-07 LAB — RAPID URINE DRUG SCREEN, HOSP PERFORMED
Amphetamines: NOT DETECTED
Barbiturates: NOT DETECTED
Benzodiazepines: NOT DETECTED
Cocaine: NOT DETECTED
Opiates: NOT DETECTED
Tetrahydrocannabinol: POSITIVE — AB

## 2020-05-07 MED ORDER — CLOPIDOGREL BISULFATE 75 MG PO TABS
75.0000 mg | ORAL_TABLET | Freq: Every day | ORAL | Status: DC
  Administered 2020-05-07: 75 mg via ORAL
  Filled 2020-05-07: qty 1

## 2020-05-07 MED ORDER — PREDNISONE 50 MG PO TABS
1250.0000 mg | ORAL_TABLET | Freq: Every day | ORAL | Status: DC
  Filled 2020-05-07: qty 25

## 2020-05-07 MED ORDER — ASPIRIN 81 MG PO CHEW
81.0000 mg | CHEWABLE_TABLET | Freq: Every day | ORAL | Status: DC
  Administered 2020-05-07: 81 mg via ORAL
  Filled 2020-05-07: qty 1

## 2020-05-07 MED ORDER — PANTOPRAZOLE SODIUM 40 MG PO TBEC: 40.0000 mg | DELAYED_RELEASE_TABLET | Freq: Every day | ORAL | 0 refills | Status: AC

## 2020-05-07 MED ORDER — LORAZEPAM 2 MG/ML IJ SOLN: 2.0000 mg | Freq: Once | INTRAMUSCULAR | Status: DC | PRN

## 2020-05-07 MED ORDER — IOHEXOL 350 MG/ML SOLN
100.0000 mL | Freq: Once | INTRAVENOUS | Status: AC | PRN
  Administered 2020-05-07: 100 mL via INTRAVENOUS

## 2020-05-07 MED ORDER — ATORVASTATIN CALCIUM 10 MG PO TABS: 40.0000 mg | ORAL_TABLET | Freq: Every day | ORAL | Status: DC

## 2020-05-07 MED ORDER — PREDNISONE 50 MG PO TABS: 1250.0000 mg | ORAL_TABLET | Freq: Every day | ORAL | 0 refills | Status: AC

## 2020-05-07 MED ORDER — PANTOPRAZOLE SODIUM 40 MG PO TBEC: 40.0000 mg | DELAYED_RELEASE_TABLET | Freq: Every day | ORAL | Status: DC

## 2020-05-07 MED ORDER — GADOBUTROL 1 MMOL/ML IV SOLN
10.0000 mL | Freq: Once | INTRAVENOUS | Status: AC | PRN
  Administered 2020-05-07: 10 mL via INTRAVENOUS

## 2020-05-07 MED ORDER — ASPIRIN 300 MG RE SUPP: 150.0000 mg | Freq: Every day | RECTAL | Status: DC

## 2020-05-07 MED ORDER — SODIUM CHLORIDE 0.9% FLUSH: 3.0000 mL | Freq: Once | INTRAVENOUS | Status: DC

## 2020-05-07 NOTE — ED Notes (Signed)
Called pharm and spoke to ordering provider. Per ordering provider,Protonix and steroids will be picked up at pharm - not given here.

## 2020-05-07 NOTE — ED Notes (Signed)
Pt transported to MRI 

## 2020-05-07 NOTE — ED Triage Notes (Signed)
Patient fell at home last night, went to bed at 8 pm and woke up at 3am this morning with left arm and left leg weakness , speech clear with no facial asymmetry . Weak grip at left hand , alert and oriented . Denies headache/respirations unlabored.

## 2020-05-07 NOTE — ED Notes (Signed)
Patient verbalizes understanding of discharge instructions. Opportunity for questioning and answers were provided. Armband removed by staff, pt discharged from ED.  

## 2020-05-07 NOTE — Consult Note (Addendum)
NEUROLOGY CONSULTATION NOTE   Date of service: May 07, 2020 Patient Name: Gail Stanley MRN:  [id number] DOB:  26-Feb-1990 Reason for consult: "stroke code" _ _ _   _ __   _ __ _ _  __ __   _ __   __ _  History of Present Illness  Gail Stanley is a 30 y.o. female with PMH significant for hypothyroidism who reports sudden onset vertigo and off balance that started on 1800 on 05/06/20. She thought she was just tired so went to bed and woke up at 0300 on 05/07/20 and dropped her phone from her hand. She continued to feel off balance and leaning to her left so she came to the ED.  Family hx of strokes, no prior hx of stroke, endorses a remote misscarriage, not on OCP(has had tubal ligation), no hx of seizures. No recent visit to hair salons, no recent whip lash injury, no recent chiropractic visits with neck manipulation.  She endorses smoking 1 to 1.5 ppd for the last several years.  NIHSS components Score: Comment  1a Level of Conscious 0[x] 1[] 2[] 3[]     1b LOC Questions 0[x] 1[] 2[]      1c LOC Commands 0[x] 1[] 2[]      2 Best Gaze 0[x] 1[] 2[]      3 Visual 0[x] 1[] 2[] 3[]     4 Facial Palsy 0[x] 1[] 2[] 3[]     5a Motor Arm - left 0[x] 1[] 2[] 3[] 4[] UN[]   5b Motor Arm - Right 0[x] 1[] 2[] 3[] 4[] UN[]   6a Motor Leg - Left 0[x] 1[] 2[] 3[] 4[] UN[]   6b Motor Leg - Right 0[x] 1[] 2[] 3[] 4[] UN[]   7 Limb Ataxia 0[] 1[] 2[x] 3[] UN[]    8 Sensory 0[x] 1[] 2[] UN[]     9 Best Language 0[x] 1[] 2[] 3[]     10 Dysarthria 0[x] 1[] 2[] UN[]     11 Extinct. and Inattention 0[x] 1[] 2[]      TOTAL: 2    MRS: 0   ROS   Constitutional Denies weight loss, fever and chills.   HEENT Denies changes in vision and hearing.   Respiratory Denies SOB and cough.   CV Denies palpitations and CP   GI Denies abdominal pain, nausea, vomiting and diarrhea.   GU Denies dysuria and urinary frequency.   MSK Denies myalgia and joint pain.   Skin Denies rash and pruritus.   Neurological  Denies headache and syncope.   Psychiatric Denies recent changes in mood. Denies anxiety and depression.    Past History   Past Medical History:  Diagnosis Date  . Hypothyroidism    Past Surgical History:  Procedure Laterality Date  . ADENOIDECTOMY    . DILATION AND CURETTAGE OF UTERUS    . MYRINGOTOMY    . TONSILLECTOMY    . TUBAL LIGATION     Family History  Problem Relation Age of Onset  . Anesthesia problems Neg Hx    Social History   Socioeconomic History  . Marital status: Married    Spouse name: Not on file  . Number of children: Not on file  . Years of education: Not on file  . Highest education level: Not on file  Occupational History  . Not on file  Tobacco Use  . Smoking status: Current Some Day Smoker    Packs/day: 0.50    Types: Cigarettes  .  Smokeless tobacco: Never Used  Vaping Use  . Vaping Use: Never used  Substance and Sexual Activity  . Alcohol use: No  . Drug use: No  . Sexual activity: Yes    Birth control/protection: Surgical  Other Topics Concern  . Not on file  Social History Narrative  . Not on file   Social Determinants of Health   Financial Resource Strain: Not on file  Food Insecurity: Not on file  Transportation Needs: Not on file  Physical Activity: Not on file  Stress: Not on file  Social Connections: Not on file   Allergies  Allergen Reactions  . Bee Venom Anaphylaxis  . Penicillins Anaphylaxis, Itching and Swelling    Throat swelling Has patient had a PCN reaction causing immediate rash, facial/tongue/throat swelling, SOB or lightheadedness with hypotension: Yes Has patient had a PCN reaction causing severe rash involving mucus membranes or skin necrosis: No Has patient had a PCN reaction that required hospitalization: No Has patient had a PCN reaction occurring within the last 10 years: No If all of the above answers are "NO", then may proceed with Cephalosporin use.     Medications  (Not in a hospital  admission)    Vitals   Vitals:   05/07/20 0516 05/07/20 0807 05/07/20 0830 05/07/20 0845  BP:  109/83 106/71 116/82  Pulse:  62 64 66  Resp:  _0 Temp:  98.6 F (37 C)    TempSrc:  Oral    SpO2:  98% 98% 96%  Weight: (!) 155 kg     Height: 5' 11" (1.803 m)        Body mass index is 47.66 kg/m.  Physical Exam   General: Laying comfortably in bed; in no acute distress.  HENT: Normal oropharynx and mucosa. Normal external appearance of ears and nose.  Neck: Supple, no pain or tenderness  CV: No JVD. No peripheral edema.  Pulmonary: Symmetric Chest rise. Normal respiratory effort.  Abdomen: Soft to touch, non-tender.  Ext: No cyanosis, edema, or deformity  Skin: No rash. Normal palpation of skin.   Musculoskeletal: Normal digits and nails by inspection. No clubbing.   Neurologic Examination  Mental status/Cognition: Alert, oriented to self, place, month and year, good attention.  Speech/language: Fluent, comprehension intact, object naming intact, repetition intact.  Cranial nerves:   CN II Pupils equal and reactive to light, no VF deficits    CN III,IV,VI EOM intact, no gaze preference or deviation, saccadic pursuits.   CN V normal sensation in V1, V2, and V3 segments bilaterally   CN VII no asymmetry, no nasolabial fold flattening   CN VIII normal hearing to speech   CN IX & X normal palatal elevation, no uvular deviation   CN XI 5/5 head turn and 5/5 shoulder shrug bilaterally   CN XII midline tongue protrusion   Motor:  Muscle bulk: normal, tone normal, pronator drift none tremor none Mvmt Root Nerve  Muscle Right Left Comments  SA C5/6 Ax Deltoid 5 5   EF C5/6 Mc Biceps 5 5   EE C6/7/8 Rad Triceps 5 5   WF C6/7 Med FCR     WE C7/8 PIN ECU     F Ab C8/T1 U ADM/FDI 5 4+   HF L1/2/3 Fem Illopsoas 5 4+   KE L2/3/4 Fem Quad 5 5   DF L4/5 D Peron Tib Ant 5 5   PF S1/2 Tibial Grc/Sol 5 5    Reflexes:  Right Left Comments  Pectoralis      Biceps (C5/6) 2  2   Brachioradialis (C5/6) 2 2    Triceps (C6/7) 2 2    Patellar (L3/4) 2 2    Achilles (S1)      Hoffman      Plantar     Jaw jerk    Sensation:  Light touch intact   Pin prick    Temperature    Vibration   Proprioception    Coordination/Complex Motor:  - Finger to Nose wioth LUE ataxia. - Heel to shin with ataxia in LLE - Rapid alternating movement are slowed in LUE and LLE - Gait: Deferred.  Labs   CBC:  Recent Labs  Lab 05/07/20 0520 05/07/20 0529  WBC 8.0  --   NEUTROABS 5.5  --   HGB 14.5 15.3*  HCT 42.6 45.0  MCV 93.0  --   PLT 250  --     Basic Metabolic Panel:  Lab Results  Component Value Date   NA 141 05/07/2020   K 4.1 05/07/2020   CO2 25 05/07/2020   GLUCOSE 112 (H) 05/07/2020   BUN 11 05/07/2020   CREATININE 0.60 05/07/2020   CALCIUM 9.2 05/07/2020   GFRNONAA >60 05/07/2020   GFRAA  12/15/2007    >60        The eGFR has been calculated using the MDRD equation. This calculation has not been validated in all clinical   Lipid Panel: No results found for: LDLCALC HgbA1c: No results found for: HGBA1C Urine Drug Screen: No results found for: LABOPIA, COCAINSCRNUR, LABBENZ, AMPHETMU, THCU, LABBARB  Alcohol Level No results found for: ETH  CT Head without contrast: CTH was negative for a large hypodensity concerning for a large territory infarct or hyperdensity concerning for an ICH  CT angio Head and Neck with contrast: No LVO on my read.  CT Perfusion: No mismatch.  MRI Brain: pending  Impression   Gail Stanley is a 30 y.o. female with PMH significant for hypothyroidism who presents with acute onset LUE and LLE incoordination, blurred vision on the left temporal side and feeling off balance and wobbly. Her neurologic examination is notable for ataxia and slowed alternating movements in LUE and LLE with a NIHSS of 2 and mRS of 0.  Symptoms are concerning for posteroir circulation stroke vs Multiple sclerosis.  CTH, CTA and CT  perfusion with no large territory infarct, no LVO , no mismatch.  Will get an   Recommendations   - MRI Brain w/o contrast/ - Counseled on the importance of quitting smoking to decrease future risk of stroke. - Will need hypercoagulable workup if MRI Brain demonstrates a stroke. - Will give 1 time aspirin 57m along with plavix 735m  Update: - MRI Brain completed and shows several T2/FLAIR hyperintense lesions concerning for MS. - MRI Brain with contrast completed and shows R temporoparietal juxtacortical enhancing lesion suspicious for active demyelination.  Patient would like to go home as she has no arrangements to pick up her daughters. Discussed with her and will do PO Prednisone 125069maily x 5 doses along with Pantoprazole 110m69mily x 5 doses. Will also get NMO Panel along with MOG Ab syndrome panel. Recommend follow up with outpatient neurologist for further workup for potential MS and will benefit from imaging of her spinal cord to look for spine lesions.   ______________________________________________________________________   Thank you for the opportunity to take part in the care of this patient. If you have any further questions,  please contact the neurology consultation attending.  Signed,  [place] Pager Number [phone] _ _ _   _ __   _ __ _ _  __ __   _ __   __ _

## 2020-05-07 NOTE — ED Provider Notes (Signed)
[facility] EMERGENCY DEPARTMENT Provider Note   CSN: [id number] Arrival date & time: 05/07/20  7591     History Chief Complaint  Patient presents with  . left Side Weakness/Lightheaded    LSN 8 pm last night    Gail Stanley is a 31 y.o. female.  HPI   30 year old female with past medical history of HTN, obesity presents emergency department concern for dizziness.  Patient states yesterday around 6 PM when she was cooking dinner she had sudden onset dizziness.  She felt like she was drunk.  She states when she was walking she was falling to the left side, felt fatigued so she decided to go to bed instead of eating dinner.  It is normal for her to go to bed this early because she typically wakes up around 3 AM.  Patient states that she slept okay, when her alarm went off at 3 AM she had the immediate feeling of dizziness.  She states she was still veering to the left side when walking, dropping objects and using her left upper extremity and felt like there was some blurry vision on the peripheral aspect of the left eye.  She says her significant other thought her speech was slow/slurred but there was no noted aphasia or facial droop.  No numbness.  Past Medical History:  Diagnosis Date  . Hypothyroidism     Patient Active Problem List   Diagnosis Date Noted  . Low grade squamous intraepithelial lesion (LGSIL) on Papanicolaou smear of cervix 03/24/2013  . OBESITY, NOS 04/08/2006  . TOBACCO USE, QUIT 04/08/2006    Past Surgical History:  Procedure Laterality Date  . ADENOIDECTOMY    . DILATION AND CURETTAGE OF UTERUS    . MYRINGOTOMY    . TONSILLECTOMY    . TUBAL LIGATION       OB History    Gravida  3   Para  2   Term  2   Preterm  0   AB  1   Living  2     SAB  1   IAB  0   Ectopic  0   Multiple  0   Live Births              Family History  Problem Relation Age of Onset  . Anesthesia problems Neg Hx     Social History    Tobacco Use  . Smoking status: Current Some Day Smoker    Packs/day: 0.50    Types: Cigarettes  . Smokeless tobacco: Never Used  Vaping Use  . Vaping Use: Never used  Substance Use Topics  . Alcohol use: No  . Drug use: No    Home Medications Prior to Admission medications   Medication Sig Start Date End Date Taking? Authorizing Provider  doxycycline (VIBRAMYCIN) 100 MG capsule Take 1 capsule (100 mg total) by mouth 2 (two) times daily. 07/29/17   Geoffery Lyons, MD  nitrofurantoin, macrocrystal-monohydrate, (MACROBID) 100 MG capsule Take 1 capsule (100 mg total) by mouth 2 (two) times daily. Patient not taking: Reported on 07/02/2017 03/17/17   Rolm Bookbinder, CNM  phenazopyridine (PYRIDIUM) 200 MG tablet Take 1 tablet (200 mg total) by mouth 3 (three) times daily. Patient not taking: Reported on 07/02/2017 03/17/17   Rolm Bookbinder, CNM    Allergies    Bee venom and Penicillins  Review of Systems   Review of Systems  Constitutional: Negative for chills and fever.  HENT: Negative for  congestion.   Eyes: Negative for visual disturbance.  Respiratory: Negative for shortness of breath.   Cardiovascular: Negative for chest pain.  Gastrointestinal: Negative for abdominal pain, diarrhea and vomiting.  Genitourinary: Negative for dysuria.  Skin: Negative for rash.  Neurological: Positive for dizziness, weakness, light-headedness and headaches. Negative for facial asymmetry, speech difficulty and numbness.    Physical Exam Updated Vital Signs BP 116/82   Pulse 66   Temp 98.6 F (37 C) (Oral)   Resp 17   Ht 5\' 11"  (1.803 m)   Wt (!) 155 kg   SpO2 96%   BMI 47.66 kg/m   Physical Exam Vitals and nursing note reviewed.  Constitutional:      Appearance: Normal appearance.  HENT:     Head: Normocephalic.     Mouth/Throat:     Mouth: Mucous membranes are moist.  Cardiovascular:     Rate and Rhythm: Normal rate.  Pulmonary:     Effort: Pulmonary effort is normal. No  respiratory distress.  Abdominal:     Palpations: Abdomen is soft.     Tenderness: There is no abdominal tenderness.  Skin:    General: Skin is warm.  Neurological:     Mental Status: She is alert and oriented to person, place, and time. Mental status is at baseline.     Comments: Ataxia in the left upper extremity, NIH of 1, visual fields appear equal and intact, speech is possibly slightly slow but not slurred, no aphasia, face is symmetric  Psychiatric:        Mood and Affect: Mood normal.     ED Results / Procedures / Treatments   Labs (all labs ordered are listed, but only abnormal results are displayed) Labs Reviewed  COMPREHENSIVE METABOLIC PANEL - Abnormal; Notable for the following components:      Result Value   Glucose, Bld 114 (*)    All other components within normal limits  I-STAT CHEM 8, ED - Abnormal; Notable for the following components:   Glucose, Bld 112 (*)    Hemoglobin 15.3 (*)    All other components within normal limits  CBG MONITORING, ED - Abnormal; Notable for the following components:   Glucose-Capillary 113 (*)    All other components within normal limits  RESP PANEL BY RT-PCR (FLU A&B, COVID) ARPGX2  PROTIME-INR  APTT  CBC  DIFFERENTIAL  RAPID URINE DRUG SCREEN, HOSP PERFORMED  URINALYSIS, ROUTINE W REFLEX MICROSCOPIC  I-STAT BETA HCG BLOOD, ED ([facility], [facility], [facility] ONLY)    EKG EKG Interpretation  Date/Time:  Tuesday May 07 2020 05:24:53 EDT Ventricular Rate:  73 PR Interval:  138 QRS Duration: 92 QT Interval:  412 QTC Calculation: 453 R Axis:   34 Text Interpretation: Normal sinus rhythm Normal ECG NSR, normal EKG Confirmed by [id number] ([id number]) on 05/07/2020 8:25:42 AM   Radiology CT HEAD WO CONTRAST  Result Date: 05/07/2020 CLINICAL DATA:  Fall, left arm and leg weakness, EXAM: CT HEAD WITHOUT CONTRAST TECHNIQUE: Contiguous axial images were obtained from the base of the skull through the vertex without intravenous contrast.  COMPARISON:  None. FINDINGS: Brain: Normal anatomic configuration. No abnormal intra or extra-axial mass lesion or fluid collection. No abnormal mass effect or midline shift. No evidence of acute intracranial hemorrhage or infarct. Ventricular size is normal. Cerebellum unremarkable. Vascular: Unremarkable Skull: Intact Sinuses/Orbits: Paranasal sinuses are clear. Orbits are unremarkable. Other: Mastoid air cells and middle ear cavities are clear. IMPRESSION: No acute intracranial abnormality. Electronically Signed  By: Helyn Numbers MD   On: 05/07/2020 05:38    Procedures .Critical Care Performed by: Rozelle Logan, DO Authorized by: Rozelle Logan, DO   Critical care provider statement:    Critical care time (minutes):  45   Critical care was time spent personally by me on the following activities:  Discussions with consultants, evaluation of patient's response to treatment, examination of patient, ordering and performing treatments and interventions, ordering and review of laboratory studies, ordering and review of radiographic studies, pulse oximetry, re-evaluation of patient's condition, obtaining history from patient or surrogate and review of old charts   I assumed direction of critical care for this patient from another provider in my specialty: no   Comments:     Stroke page, multiple neuro exams and neurology consults     Medications Ordered in ED Medications  sodium chloride flush (NS) 0.9 % injection 3 mL (has no administration in time range)  iohexol (OMNIPAQUE) 350 MG/ML injection 100 mL (has no administration in time range)    ED Course  I have reviewed the triage vital signs and the nursing notes.  Pertinent labs & imaging results that were available during my care of the patient were reviewed by me and considered in my medical decision making (see chart for details).    MDM Rules/Calculators/A&P                          30 year old female presents the  emergency department with dizziness/difficulty walking and weakness in the left upper extremity.  Patient arrived prior to the start of my shift.  On my initial exam she does have a concerning story and exam with left upper extremity ataxia, NIH of 1.    Blood work-up to this point has been normal, EKG is normal sinus rhythm, CT of the head shows no acute finding.  I spoke with neurology, Dr. Derry Lory who came down to examine the patient themselves.  They have concern for her physical exam as well and a stroke page was placed, patient was taken to the CT suite for a CTA of the head and neck with neurology.  CTA did not show any acute pathology but the patient's neuro exam continues to be impressive.  For this reason neurology has recommended MRI and admission.  Due to social reasons patient is unable to stay for admission so we agreed to get the MRI done and neuro recommendations.  On this MRI there was concern for possible demyelinating disease, so an MRI with contrast was done which confirms this diagnosis.  Neurology recommends daily steroid use for the next week and outpatient neurology follow-up.  The results and treatment plan have been discussed with the patient.  Patient will be discharged and treated as an outpatient.  Discharge plan and strict return to ED precautions discussed, patient verbalizes understanding and agreement.  Final Clinical Impression(s) / ED Diagnoses Final diagnoses:  None    Rx / DC Orders ED Discharge Orders    None       Rozelle Logan, DO 05/07/20 1529

## 2020-05-07 NOTE — Code Documentation (Signed)
Stroke Response Nurse Documentation Code Stroke Documentation Gail Stanley  is a 30 y.o.  y.o. female  arriving to [facility] ED via Private Vehicle on 05/07/2020 with PMH of Hypothyroidism. Code stroke was activated by ED. Patient coming from home where she was LKW at 1800 last night, patient was cooking and experienced sudden onset dizziness and felt like she was tipping to the left. Patient decided to go to sleep as she was also feeling suddenly fatigued but awoke at 0300 and when she picked up her phone it fell from her left hand, patient decided to go to ED. Patient taking No antithrombotic PTA. Stroke team met patient in CT, CT Head, CTa Head and Neck, CTP completed. Patient with left hemianopia and left limb ataxia, patient describes visual blurriness on the left side. Patient is not a candidate for tPA due to being outside of treatment window. Care/Plan: q2h neuro checks, stroke work-up. Bedside handoff with ED RN Caryl Pina.    Lenore Manner  Stroke Response RN [phone]

## 2020-05-07 NOTE — ED Notes (Signed)
Pt and SO talking with neuro. Pt alert and oriented.

## 2020-05-07 NOTE — Discharge Instructions (Addendum)
You have been seen and discharged from the emergency department.  Take the steroids and Protonix daily as prescribed.  Follow-up with neurology for further treatment and care.  Take other home medications as prescribed.  If you have any worsening symptoms or further concerns for health please return to an emergency department for further evaluation.

## 2020-05-08 LAB — NEUROMYELITIS OPTICA AUTOAB, IGG: NMO-IgG: <1.5 U/mL (ref 0.0–3.0)

## 2020-05-13 NOTE — Progress Notes (Addendum)
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  Due to persistence and severity of her type of symptoms (ataxia, incoordination, visual problems), I will order 3 to 5 day course of Solu-Medrol 1000mg . 2.  Will check MRI of cervical and thoracic spine with and without contrast.  May still consider LP 3.  Immediately after MRI, she will return to discuss treatment options. 4.  Check vitamin D level. 5.  Due to her symptoms, I explained that she may need to get FMLA for short term disability if symptoms do not improve over the next week. 6.  She was advised to follow up with her PCP regarding the lung opacity and nodule on CT.   Subjective:  Gail Stanley is a 30 year old right-handed female with HTN who presents for demyelinating disease.  History supplemented by ED note.  She is accompanied by her husband who also supplements history.  On 05/06/2020, she was cooking dinner when she suddenly became dizzy, like she was "drunk".  She was falling to the left side when walking and felt fatigued.  She skipped dinner and went to bed.  When she woke up at 3 AM, she was still dizzy, veering to the left while walking, dropping objects out of her left hand, slurred speech and blurred vision out of the periphery of her left eye.  No facial droop or numbness.  She went to [facility] ED.  CT of head unremarkable.  CTA of head and neck showed no emergent large vessel occlusion or hemodynamically significant stenosis.  Incidentally, it showed a 7 mm ground-glass opacity in the right lung apex and 4 mm nodule in the left upper lobe.  Follow up non-contrast chest CT at 3-6 months was recommended.  She was evaluated by neurology whose exam demonstrated mild  weakness in finger abduction and hip flexion on the left, slowed rapid alternating movement in the left upper and lower extremities and and ataxia with finger to nose and heel to shin on the left.  MRI of brain without contrast showed several supratentorial and infratentorial T2/FLAIR hyperintense lesions, including the deep juxtacortical and periventricular white matter, right cerebral peduncle, right pons and left middle cerebellar peduncle concerning for demyelinating disease.  Follow up MRI with contrast showed right temporoparietal juxtacortical enhancing lesion suspicious for active demyelination.  She was discharged with 5 day course of high-dose prednisone.  NMO antibody negative.  Despite finishing the prednisone, symptoms have not improved.  She has not been able to work.  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  She reports that in the past that she has had episodes of numbness and tingling in the hands and feet.  She also has evidence of migraines.    Vision:  Blurred vision in left eye Motor:  Feels weak on left.  Sometimes right side feels weak Sensory:  Numbness and tingling in arms Pain:  no Gait:  Off-balance Bowel/Bladder:  no Fatigue:  yes Cognition:  Feels "drunk" Mood:  Feels angry   05/07/2020 MRI BRAIN WO:  Scattered foci of T2 hyperintensity within the white matter of the cerebral hemispheres, including deep, juxta cortical and periventricular white matter. Foci of T2 hyperintensity are also seen in the right cerebral peduncle, right side of the pons and left middle  cerebellar peduncle. 05/07/2020 MRI BRAIN W:  As noted on the earlier noncontrast head MRI, there are scattered foci of T2 FLAIR hyperintensity in the juxtacortical, deep, and periventricular cerebral white matter as well as pons and left middle cerebellar peduncle. A juxtacortical lesion in the right temporoparietal white matter enhances (series 3, image 25). No enhancing lesions are seen elsewhere.  PAST MEDICAL  HISTORY: Past Medical History:  Diagnosis Date  . Hypothyroidism     PAST SURGICAL HISTORY: Past Surgical History:  Procedure Laterality Date  . ADENOIDECTOMY    . DILATION AND CURETTAGE OF UTERUS    . MYRINGOTOMY    . TONSILLECTOMY    . TUBAL LIGATION      MEDICATIONS: Current Outpatient Medications on File Prior to Visit  Medication Sig Dispense Refill  . doxycycline (VIBRAMYCIN) 100 MG capsule Take 1 capsule (100 mg total) by mouth 2 (two) times daily. 20 capsule 0  . nitrofurantoin, macrocrystal-monohydrate, (MACROBID) 100 MG capsule Take 1 capsule (100 mg total) by mouth 2 (two) times daily. (Patient not taking: Reported on 07/02/2017) 10 capsule 0  . pantoprazole (PROTONIX) 40 MG tablet Take 1 tablet (40 mg total) by mouth daily for 7 days. 77 tablet 0  . phenazopyridine (PYRIDIUM) 200 MG tablet Take 1 tablet (200 mg total) by mouth 3 (three) times daily. (Patient not taking: Reported on 07/02/2017) 15 tablet 0   No current facility-administered medications on file prior to visit.    ALLERGIES: Allergies  Allergen Reactions  . Bee Venom Anaphylaxis  . Penicillins Anaphylaxis, Itching and Swelling    Throat swelling Has patient had a PCN reaction causing immediate rash, facial/tongue/throat swelling, SOB or lightheadedness with hypotension: Yes Has patient had a PCN reaction causing severe rash involving mucus membranes or skin necrosis: No Has patient had a PCN reaction that required hospitalization: No Has patient had a PCN reaction occurring within the last 10 years: No If all of the above answers are "NO", then may proceed with Cephalosporin use.     FAMILY HISTORY: Family History  Problem Relation Age of Onset  . Anesthesia problems Neg Hx     Objective:  Blood pressure 137/89, pulse 96, resp. rate 18, height 5\' 11"  (1.803 m), weight 290 lb (131.5 kg), SpO2 99 %. General: No acute distress.  Patient appears well-groomed.   Head:   Normocephalic/atraumatic Eyes:  fundi examined but not visualized Neck: supple, no paraspinal tenderness, full range of motion Back: No paraspinal tenderness Heart: regular rate and rhythm Lungs: Clear to auscultation bilaterally. Vascular: No carotid bruits. Neurological Exam: Mental status: alert and oriented to person, place, and time; speech fluent and not dysarthric, language intact. Cranial nerves: CN I: not tested CN II: pupils equal, round and reactive to light, visual fields intact CN III, IV, VI:  full range of motion, no nystagmus, no ptosis CN V: facial sensation intact. CN VII: upper and lower face symmetric CN VIII: hearing intact CN IX, X: gag intact, uvula midline CN XI: sternocleidomastoid and trapezius muscles intact CN XII: tongue midline Bulk & Tone: normal, no fasciculations. Motor:  muscle strength 5-/5 left hip flexion and ankle dorsiflexion, otherwise 5/5 throughout Sensation:  Pinprick sensation reduced in left upper extremity, vibratory sensation intact.. Deep Tendon Reflexes:  2+ throughout,  toes downgoing.   Finger to nose testing:  Dysmetria on left Heel to shin:  Dysmetria left heel to right shin.   Gait:  Mildly unsteady.  Able to turn.  Romberg with mild sway  Thank you for allowing me to take part in the care of this patient.  Shon Millet, DO  CC:  Leilani Able, MD

## 2020-05-14 ENCOUNTER — Ambulatory Visit: Payer: Medicaid Other | Admitting: Neurology

## 2020-05-14 ENCOUNTER — Other Ambulatory Visit: Payer: Medicaid Other

## 2020-05-14 ENCOUNTER — Other Ambulatory Visit: Payer: Self-pay | Admitting: Pharmacy Technician

## 2020-05-14 ENCOUNTER — Telehealth: Payer: Self-pay | Admitting: Pharmacy Technician

## 2020-05-14 ENCOUNTER — Encounter: Payer: Self-pay | Admitting: Neurology

## 2020-05-14 ENCOUNTER — Other Ambulatory Visit: Payer: Self-pay

## 2020-05-14 VITALS — BP 137/89 | HR 96 | Resp 18 | Ht 71.0 in | Wt 290.0 lb

## 2020-05-14 DIAGNOSIS — G379 Demyelinating disease of central nervous system, unspecified: Secondary | ICD-10-CM | POA: Diagnosis not present

## 2020-05-14 LAB — VITAMIN D 25 HYDROXY (VIT D DEFICIENCY, FRACTURES): VITD: <7 ng/mL — ABNORMAL LOW (ref 30.00–100.00)

## 2020-05-14 LAB — ANTI-MYELIN ASSOC GLYCOP IGG: Anti-Myelin Assoc Glycop IgG: <1:10 {titer}

## 2020-05-14 NOTE — Patient Instructions (Addendum)
1.  Solu-Medrol 1000mg  daily for 5 days 2.  MRI of cervical and thoracic spine with and without contrast 3.  Check vitamin D level 4.  Follow up after testing as soon as possible  We have sent a referral to [facility] for your MRI and they will call you directly to schedule your appointment. They are located at [address] [facility]. If you need to contact them directly please call [phone].  [facility] will contact you regarding your infusion [phone].

## 2020-05-14 NOTE — Telephone Encounter (Signed)
Q7341 (Solu-medrol) does NOT require prior auth.  Ref # [id number]  PRIOR AUTH: [id number] BENEFITS# [phone]  Will have patient scheduled ASAP. (fyi)

## 2020-05-14 NOTE — Addendum Note (Signed)
Addended by: Adline Mango I on: 05/14/2020 02:02 PM   Modules accepted: Orders

## 2020-05-15 ENCOUNTER — Telehealth: Payer: Self-pay | Admitting: Neurology

## 2020-05-15 ENCOUNTER — Ambulatory Visit (INDEPENDENT_AMBULATORY_CARE_PROVIDER_SITE_OTHER): Payer: Medicaid Other

## 2020-05-15 ENCOUNTER — Telehealth: Payer: Self-pay

## 2020-05-15 VITALS — BP 131/86 | HR 78 | Temp 98.5°F | Resp 18

## 2020-05-15 DIAGNOSIS — G379 Demyelinating disease of central nervous system, unspecified: Secondary | ICD-10-CM | POA: Diagnosis not present

## 2020-05-15 MED ORDER — HEPARIN SOD (PORK) LOCK FLUSH 100 UNIT/ML IV SOLN: 500.0000 [IU] | Freq: Once | INTRAVENOUS | Status: DC | PRN

## 2020-05-15 MED ORDER — ALTEPLASE 2 MG IJ SOLR: 2.0000 mg | Freq: Once | INTRAMUSCULAR | Status: DC | PRN

## 2020-05-15 MED ORDER — SODIUM CHLORIDE 0.9 % IV SOLN
1000.0000 mg | Freq: Once | INTRAVENOUS | Status: AC
  Administered 2020-05-15: 1000 mg via INTRAVENOUS
  Filled 2020-05-15: qty 8

## 2020-05-15 MED ORDER — METHYLPREDNISOLONE SODIUM SUCC 1000 MG IJ SOLR
1000.0000 mg | Freq: Once | INTRAMUSCULAR | Status: DC
  Filled 2020-05-15: qty 8

## 2020-05-15 MED ORDER — ANTICOAGULANT SODIUM CITRATE 4% (200MG/5ML) IV SOLN
5.0000 mL | Freq: Once | Status: DC | PRN
  Filled 2020-05-15: qty 5

## 2020-05-15 MED ORDER — SODIUM CHLORIDE 0.9% FLUSH: 3.0000 mL | Freq: Once | INTRAVENOUS | Status: DC | PRN

## 2020-05-15 MED ORDER — HEPARIN SOD (PORK) LOCK FLUSH 100 UNIT/ML IV SOLN: 250.0000 [IU] | Freq: Once | INTRAVENOUS | Status: DC | PRN

## 2020-05-15 MED ORDER — SODIUM CHLORIDE 0.9% FLUSH: 10.0000 mL | Freq: Once | INTRAVENOUS | Status: DC | PRN

## 2020-05-15 NOTE — Telephone Encounter (Signed)
Patient called and said she needs a prescription for vitamin D per her pharmacist.  [facility] [address]

## 2020-05-15 NOTE — Telephone Encounter (Signed)
Patient called and stated she couldn't find the 50,000 unit vitamin D. Informed patient to ask the pharmacist at [facility] if they carry that dose of Vitamin D. Informed patient that if she is unable to locate Vitamin D 50,000 unit to give [place] a call to have a prescription sent. Patient verbalized understanding.

## 2020-05-15 NOTE — Telephone Encounter (Signed)
-----   Message from Drema Dallas, DO sent at 05/15/2020  6:30 AM EDT ----- Vitamin D level is very low (almost non-existent).  Recommend starting D3 50,000 units once a week.  It should be available over the counter.

## 2020-05-15 NOTE — Telephone Encounter (Signed)
Called patient and informed her of labs and recommendations per Dr. Everlena Cooper. Patient verbalized understanding and will purchase vitamin D. Patient understood directions of taking 50,000 units of Vitamin D once a week.

## 2020-05-15 NOTE — Progress Notes (Signed)
Diagnosis: Demyelinating Disease  Provider:  Chilton Greathouse, MD  Procedure: Infusion  IV Type: Peripheral, IV Location: R Antecubital  Solumedrol (Methylprednisolone), Dose: 1000mg   Infusion Start time: 11:48am  Infusion Stop Time: 12:48pm  Post Infusion IV Care: Observation period completed, IV discontinued  Discharge: Condition: Good, Destination: Home . AVS provided to patient.   Performed by:  , RN

## 2020-05-16 ENCOUNTER — Other Ambulatory Visit: Payer: Self-pay | Admitting: Neurology

## 2020-05-16 ENCOUNTER — Other Ambulatory Visit: Payer: Self-pay

## 2020-05-16 ENCOUNTER — Ambulatory Visit (INDEPENDENT_AMBULATORY_CARE_PROVIDER_SITE_OTHER): Payer: Medicaid Other

## 2020-05-16 VITALS — BP 135/96 | HR 80 | Temp 98.2°F | Resp 16

## 2020-05-16 DIAGNOSIS — G379 Demyelinating disease of central nervous system, unspecified: Secondary | ICD-10-CM | POA: Diagnosis not present

## 2020-05-16 MED ORDER — D3-50 1.25 MG (50000 UT) PO CAPS: 50000.0000 [IU] | ORAL_CAPSULE | ORAL | 5 refills | Status: DC

## 2020-05-16 MED ORDER — METHYLPREDNISOLONE SODIUM SUCC 1000 MG IJ SOLR
1000.0000 mg | Freq: Once | INTRAMUSCULAR | Status: AC
Start: 2020-05-16 — End: 2020-05-16
  Administered 2020-05-16: 1000 mg via INTRAVENOUS
  Filled 2020-05-16: qty 8

## 2020-05-16 NOTE — Progress Notes (Signed)
Diagnosis: Demyelination Disease  Provider:  Chilton Greathouse, MD  Procedure: Infusion  IV Type: Peripheral, IV Location: R Antecubital  Solumedrol (Methylprednisolone), Dose: 1000mg   Infusion Start Time: 0851am  Infusion Stop Time: 0951am  Post Infusion IV Care: Observation period completed and Peripheral IV Discontinued  Discharge: Condition: Good, Destination: Home . AVS provided to patient.   Performed by:  , RN

## 2020-05-16 NOTE — Telephone Encounter (Signed)
Done

## 2020-05-17 ENCOUNTER — Ambulatory Visit (INDEPENDENT_AMBULATORY_CARE_PROVIDER_SITE_OTHER): Payer: Medicaid Other

## 2020-05-17 VITALS — BP 133/84 | HR 71 | Temp 98.5°F | Resp 20

## 2020-05-17 DIAGNOSIS — G379 Demyelinating disease of central nervous system, unspecified: Secondary | ICD-10-CM | POA: Diagnosis not present

## 2020-05-17 MED ORDER — SODIUM CHLORIDE 0.9 % IV SOLN
1000.0000 mg | Freq: Once | INTRAVENOUS | Status: DC
  Filled 2020-05-17: qty 8

## 2020-05-17 MED ORDER — METHYLPREDNISOLONE SODIUM SUCC 1000 MG IJ SOLR
1000.0000 mg | Freq: Once | INTRAMUSCULAR | Status: AC
  Administered 2020-05-17: 1000 mg via INTRAVENOUS
  Filled 2020-05-17 (×2): qty 8

## 2020-05-17 NOTE — Progress Notes (Signed)
Diagnosis: Demyelination Disease  Provider:  Chilton Greathouse, MD  Procedure: Infusion  IV Type: Peripheral, IV Location: L Antecubital  Solumedrol (Methylprednisolone), Dose: 1000mg   Infusion Start Time: 1108  Infusion Stop Time: 1208  Post Infusion IV Care: IV disccontinued and catheter tip intact.  Discharge: Condition: Good, Destination: Home . AVS provided to patient.   Performed by:  [id number], RN

## 2020-06-18 ENCOUNTER — Ambulatory Visit
Admission: RE | Admit: 2020-06-18 | Discharge: 2020-06-18 | Disposition: A | Discharge status: Home / Self Care | Payer: Medicaid Other | Source: Ambulatory Visit | Attending: Neurology | Admitting: Neurology

## 2020-06-18 ENCOUNTER — Other Ambulatory Visit: Payer: Self-pay

## 2020-06-18 DIAGNOSIS — G379 Demyelinating disease of central nervous system, unspecified: Secondary | ICD-10-CM

## 2020-06-18 MED ORDER — GADOBENATE DIMEGLUMINE 529 MG/ML IV SOLN
20.0000 mL | Freq: Once | INTRAVENOUS | Status: AC | PRN
  Administered 2020-06-18: 20 mL via INTRAVENOUS

## 2020-06-20 ENCOUNTER — Telehealth: Payer: Self-pay

## 2020-06-20 DIAGNOSIS — G379 Demyelinating disease of central nervous system, unspecified: Secondary | ICD-10-CM

## 2020-06-20 NOTE — Telephone Encounter (Signed)
Pt advised of his Mri results.    LP Order sent to [facility].

## 2020-06-20 NOTE — Telephone Encounter (Signed)
-----   Message from Gail Dallas, DO sent at 06/18/2020  3:57 PM EDT ----- MRI of the spinal cord is normal.  If there was evidence of MS involving the spinal cord, then I would definitively diagnose multiple sclerosis.  However, we need to proceed with spinal tap to help further establish diagnosis - I would check CSF for cell count, protein, glucose, cytology, gram stain and culture, oligoclonal bands and IgG index.  I would like for her to make a follow up appointment afterwards (at least a week out to ensure that we get all of the results) to discuss plan going forward.

## 2020-10-05 ENCOUNTER — Other Ambulatory Visit: Payer: Self-pay | Admitting: Neurology
